# Patient Record
Sex: Female | Born: 1935 | Race: White | Hispanic: No | Marital: Married | Smoking: Former smoker
Health system: Southern US, Community
[De-identification: ages and names within clinical notes are randomized; demographics above are authoritative.]

## PROBLEM LIST (undated history)

## (undated) DIAGNOSIS — G4733 Obstructive sleep apnea (adult) (pediatric): Secondary | ICD-10-CM

## (undated) DIAGNOSIS — Z8711 Personal history of peptic ulcer disease: Secondary | ICD-10-CM

## (undated) DIAGNOSIS — M1990 Unspecified osteoarthritis, unspecified site: Secondary | ICD-10-CM

## (undated) DIAGNOSIS — Z9989 Dependence on other enabling machines and devices: Secondary | ICD-10-CM

## (undated) DIAGNOSIS — R61 Generalized hyperhidrosis: Secondary | ICD-10-CM

## (undated) DIAGNOSIS — Z8719 Personal history of other diseases of the digestive system: Secondary | ICD-10-CM

## (undated) DIAGNOSIS — I6529 Occlusion and stenosis of unspecified carotid artery: Secondary | ICD-10-CM

## (undated) DIAGNOSIS — N6019 Diffuse cystic mastopathy of unspecified breast: Secondary | ICD-10-CM

## (undated) DIAGNOSIS — K2950 Unspecified chronic gastritis without bleeding: Secondary | ICD-10-CM

## (undated) DIAGNOSIS — R4702 Dysphasia: Secondary | ICD-10-CM

## (undated) DIAGNOSIS — L539 Erythematous condition, unspecified: Secondary | ICD-10-CM

## (undated) DIAGNOSIS — E042 Nontoxic multinodular goiter: Secondary | ICD-10-CM

## (undated) DIAGNOSIS — M4800 Spinal stenosis, site unspecified: Secondary | ICD-10-CM

## (undated) DIAGNOSIS — K589 Irritable bowel syndrome without diarrhea: Secondary | ICD-10-CM

## (undated) DIAGNOSIS — M419 Scoliosis, unspecified: Secondary | ICD-10-CM

## (undated) DIAGNOSIS — I341 Nonrheumatic mitral (valve) prolapse: Secondary | ICD-10-CM

## (undated) DIAGNOSIS — K219 Gastro-esophageal reflux disease without esophagitis: Secondary | ICD-10-CM

## (undated) DIAGNOSIS — M47819 Spondylosis without myelopathy or radiculopathy, site unspecified: Secondary | ICD-10-CM

## (undated) DIAGNOSIS — I10 Essential (primary) hypertension: Secondary | ICD-10-CM

## (undated) HISTORY — PX: OTHER SURGICAL HISTORY: SHX169

## (undated) HISTORY — DX: Irritable bowel syndrome, unspecified: K58.9

## (undated) HISTORY — PX: COLONOSCOPY: SHX174

## (undated) HISTORY — DX: Diffuse cystic mastopathy of unspecified breast: N60.19

## (undated) HISTORY — PX: CYSTOSCOPY: SUR368

## (undated) HISTORY — DX: Essential (primary) hypertension: I10

## (undated) HISTORY — DX: Occlusion and stenosis of unspecified carotid artery: I65.29

## (undated) HISTORY — PX: ESOPHAGOGASTRODUODENOSCOPY ENDOSCOPY: SHX5814

## (undated) HISTORY — DX: Unspecified osteoarthritis, unspecified site: M19.90

## (undated) HISTORY — DX: Generalized hyperhidrosis: R61

## (undated) HISTORY — DX: Nonrheumatic mitral (valve) prolapse: I34.1

---
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
## 1976-05-02 HISTORY — PX: VAGINAL HYSTERECTOMY: SUR661

## 1994-05-02 HISTORY — PX: CHOLECYSTECTOMY: SHX55

## 1995-05-03 HISTORY — PX: STOMACH SURGERY: SHX791

## 1997-09-26 ENCOUNTER — Other Ambulatory Visit: Admission: RE | Admit: 1997-09-26 | Discharge: 1997-09-26 | Payer: Self-pay | Admitting: Urology

## 1998-06-02 ENCOUNTER — Ambulatory Visit (HOSPITAL_COMMUNITY): Admission: RE | Admit: 1998-06-02 | Discharge: 1998-06-02 | Payer: Self-pay | Admitting: Gastroenterology

## 2003-05-03 DIAGNOSIS — I341 Nonrheumatic mitral (valve) prolapse: Secondary | ICD-10-CM

## 2003-05-03 HISTORY — DX: Nonrheumatic mitral (valve) prolapse: I34.1

## 2003-05-03 HISTORY — PX: OTHER SURGICAL HISTORY: SHX169

## 2003-10-02 ENCOUNTER — Other Ambulatory Visit: Payer: Self-pay

## 2004-02-02 ENCOUNTER — Ambulatory Visit: Payer: Self-pay | Admitting: General Surgery

## 2004-02-17 ENCOUNTER — Emergency Department: Payer: Self-pay | Admitting: Emergency Medicine

## 2004-02-18 ENCOUNTER — Ambulatory Visit: Payer: Self-pay | Admitting: Urology

## 2004-02-24 ENCOUNTER — Ambulatory Visit: Payer: Self-pay | Admitting: Unknown Physician Specialty

## 2004-02-28 ENCOUNTER — Emergency Department: Payer: Self-pay | Admitting: Emergency Medicine

## 2004-03-04 ENCOUNTER — Ambulatory Visit: Payer: Self-pay | Admitting: Obstetrics and Gynecology

## 2004-03-04 ENCOUNTER — Other Ambulatory Visit: Payer: Self-pay

## 2004-03-05 ENCOUNTER — Ambulatory Visit: Payer: Self-pay | Admitting: Unknown Physician Specialty

## 2004-03-09 ENCOUNTER — Ambulatory Visit: Payer: Self-pay | Admitting: Obstetrics and Gynecology

## 2004-08-04 ENCOUNTER — Ambulatory Visit: Payer: Self-pay | Admitting: Unknown Physician Specialty

## 2004-10-06 ENCOUNTER — Ambulatory Visit: Payer: Self-pay | Admitting: General Surgery

## 2005-01-21 ENCOUNTER — Other Ambulatory Visit: Payer: Self-pay

## 2005-01-21 ENCOUNTER — Inpatient Hospital Stay: Payer: Self-pay | Admitting: Internal Medicine

## 2005-06-03 ENCOUNTER — Ambulatory Visit: Payer: Self-pay | Admitting: Gastroenterology

## 2005-06-30 ENCOUNTER — Emergency Department: Payer: Self-pay | Admitting: Unknown Physician Specialty

## 2005-07-08 ENCOUNTER — Ambulatory Visit: Payer: Self-pay | Admitting: Unknown Physician Specialty

## 2005-10-20 ENCOUNTER — Ambulatory Visit: Payer: Self-pay | Admitting: General Surgery

## 2005-12-21 ENCOUNTER — Ambulatory Visit: Payer: Self-pay | Admitting: Gastroenterology

## 2006-02-10 ENCOUNTER — Ambulatory Visit: Payer: Self-pay | Admitting: Gastroenterology

## 2006-05-13 ENCOUNTER — Inpatient Hospital Stay: Payer: Self-pay | Admitting: Internal Medicine

## 2006-05-13 ENCOUNTER — Other Ambulatory Visit: Payer: Self-pay

## 2006-05-26 ENCOUNTER — Ambulatory Visit: Payer: Self-pay | Admitting: Unknown Physician Specialty

## 2006-10-23 ENCOUNTER — Emergency Department: Payer: Self-pay | Admitting: Emergency Medicine

## 2006-10-31 ENCOUNTER — Ambulatory Visit: Payer: Self-pay | Admitting: General Surgery

## 2006-11-07 ENCOUNTER — Ambulatory Visit: Payer: Self-pay | Admitting: General Surgery

## 2007-01-22 ENCOUNTER — Other Ambulatory Visit: Payer: Self-pay

## 2007-01-22 ENCOUNTER — Emergency Department: Payer: Self-pay | Admitting: Emergency Medicine

## 2007-02-01 ENCOUNTER — Emergency Department: Payer: Self-pay | Admitting: Unknown Physician Specialty

## 2007-02-01 ENCOUNTER — Other Ambulatory Visit: Payer: Self-pay

## 2007-02-05 ENCOUNTER — Emergency Department: Payer: Self-pay | Admitting: Emergency Medicine

## 2007-02-05 ENCOUNTER — Other Ambulatory Visit: Payer: Self-pay

## 2007-05-16 ENCOUNTER — Ambulatory Visit: Payer: Self-pay | Admitting: General Surgery

## 2007-11-01 ENCOUNTER — Ambulatory Visit: Payer: Self-pay | Admitting: General Surgery

## 2007-11-22 ENCOUNTER — Ambulatory Visit: Payer: Self-pay | Admitting: Gastroenterology

## 2008-04-08 ENCOUNTER — Ambulatory Visit: Payer: Self-pay | Admitting: Unknown Physician Specialty

## 2008-04-21 ENCOUNTER — Ambulatory Visit: Payer: Self-pay | Admitting: Gastroenterology

## 2008-05-05 ENCOUNTER — Ambulatory Visit: Payer: Self-pay | Admitting: Gastroenterology

## 2008-06-24 ENCOUNTER — Ambulatory Visit: Payer: Self-pay | Admitting: Podiatry

## 2008-11-18 ENCOUNTER — Ambulatory Visit: Payer: Self-pay | Admitting: General Surgery

## 2008-11-18 ENCOUNTER — Ambulatory Visit: Payer: Self-pay | Admitting: Unknown Physician Specialty

## 2008-12-23 ENCOUNTER — Encounter (INDEPENDENT_AMBULATORY_CARE_PROVIDER_SITE_OTHER): Payer: Self-pay | Admitting: Interventional Radiology

## 2008-12-23 ENCOUNTER — Other Ambulatory Visit: Admission: RE | Admit: 2008-12-23 | Discharge: 2008-12-23 | Payer: Self-pay | Admitting: Interventional Radiology

## 2008-12-23 ENCOUNTER — Encounter: Admission: RE | Admit: 2008-12-23 | Discharge: 2008-12-23 | Payer: Self-pay | Admitting: Surgery

## 2009-01-16 IMAGING — CR DG CHEST 1V PORT
1 series · 1 of 1 positions shown · non-contrast
Comparison: none

REASON FOR EXAM: Chest pain
COMMENTS:

PROCEDURE:     DXR - DXR PORTABLE CHEST SINGLE VIEW  - January 22, 2007 [DATE]
RESULT:     Comparison is made to a prior exam of 05/13/2006.
The lung fields are clear. The heart, mediastinal and osseous structures
show no significant abnormalities.

[view not recorded]
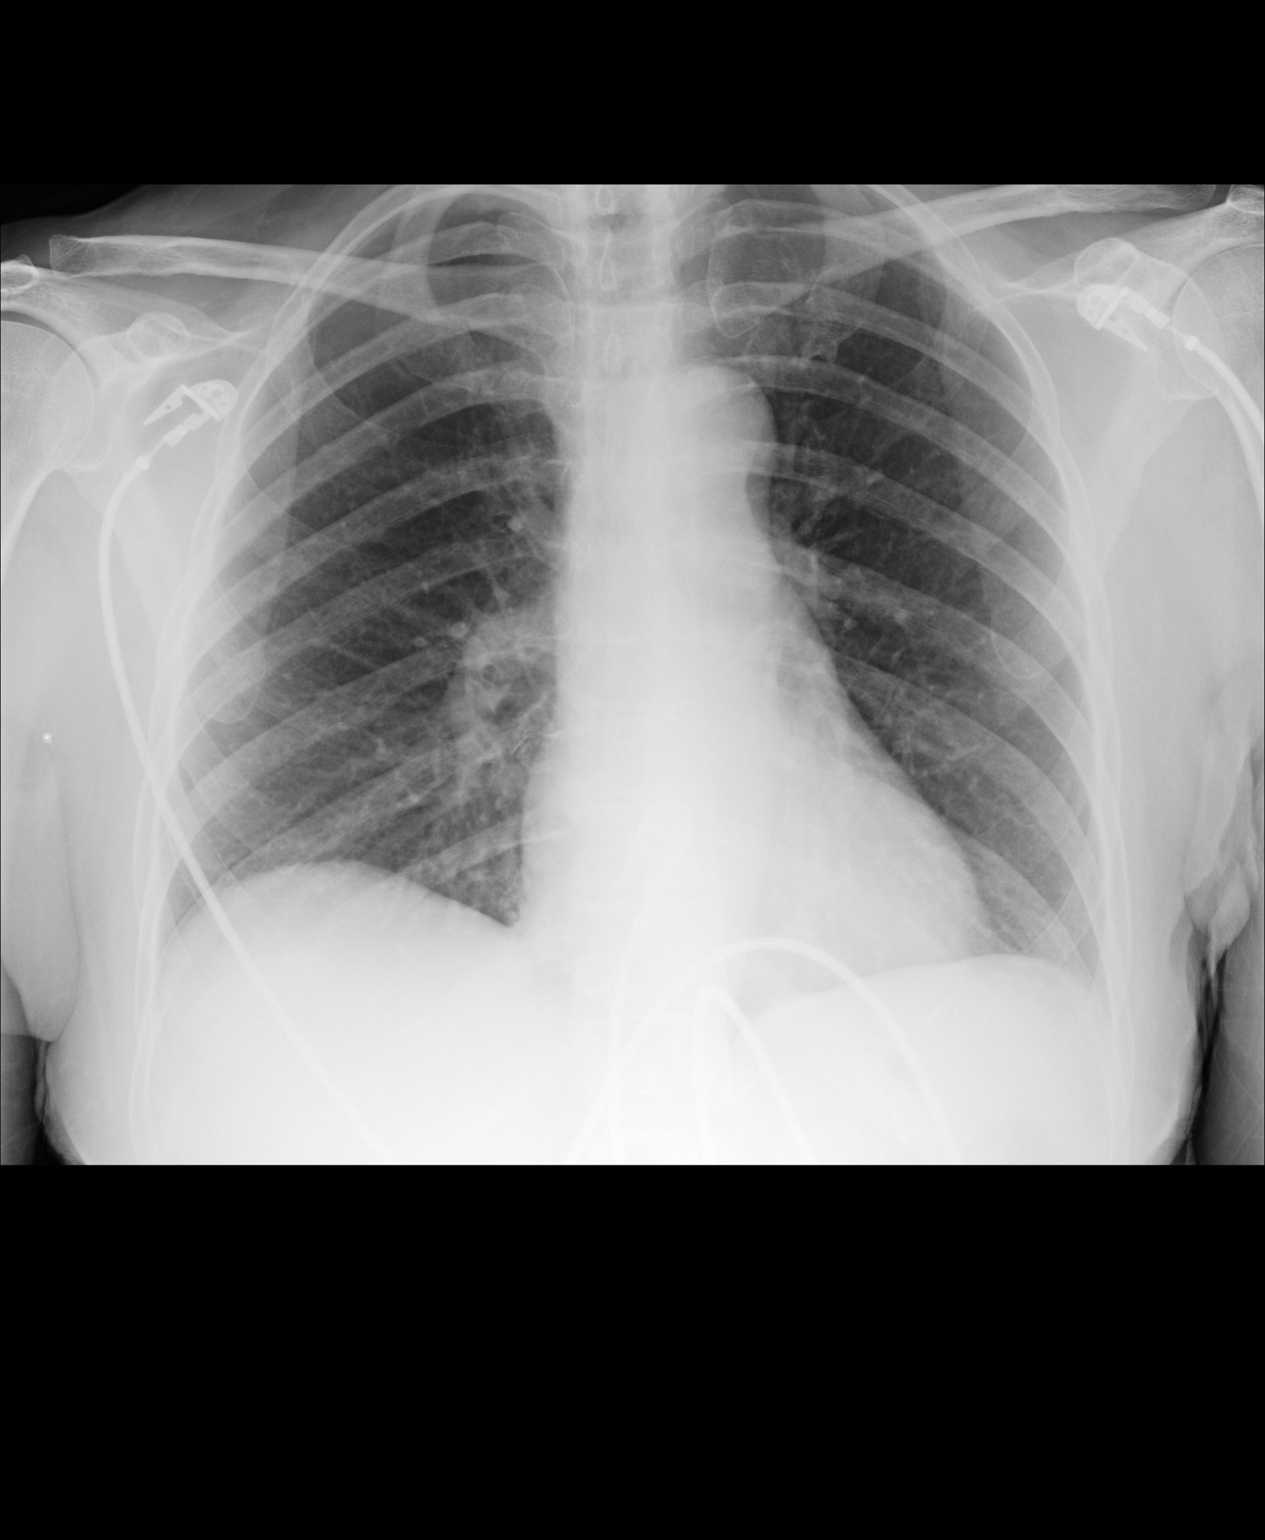

[1 of 1 positions shown; findings below may reference images not displayed]

IMPRESSION: No acute changes are identified.

## 2009-05-02 HISTORY — PX: ROTATOR CUFF REPAIR: SHX139

## 2009-06-17 ENCOUNTER — Encounter: Admission: RE | Admit: 2009-06-17 | Discharge: 2009-06-17 | Payer: Self-pay | Admitting: Surgery

## 2009-11-30 ENCOUNTER — Ambulatory Visit: Payer: Self-pay | Admitting: General Surgery

## 2010-06-17 ENCOUNTER — Observation Stay: Payer: Self-pay | Admitting: Internal Medicine

## 2010-06-28 ENCOUNTER — Other Ambulatory Visit: Payer: Self-pay | Admitting: Surgery

## 2010-06-28 ENCOUNTER — Ambulatory Visit
Admission: RE | Admit: 2010-06-28 | Discharge: 2010-06-28 | Disposition: A | Discharge status: Home / Self Care | Payer: MEDICARE | Source: Ambulatory Visit | Attending: Surgery | Admitting: Surgery

## 2010-06-28 DIAGNOSIS — E041 Nontoxic single thyroid nodule: Secondary | ICD-10-CM

## 2010-06-29 ENCOUNTER — Ambulatory Visit (HOSPITAL_COMMUNITY): Admission: RE | Admit: 2010-06-29 | Payer: MEDICARE | Source: Ambulatory Visit

## 2010-06-29 ENCOUNTER — Other Ambulatory Visit (HOSPITAL_COMMUNITY): Payer: Self-pay | Admitting: Surgery

## 2010-06-29 ENCOUNTER — Other Ambulatory Visit: Payer: Self-pay | Admitting: Surgery

## 2010-06-29 DIAGNOSIS — E041 Nontoxic single thyroid nodule: Secondary | ICD-10-CM

## 2010-06-29 DIAGNOSIS — R1310 Dysphagia, unspecified: Secondary | ICD-10-CM

## 2010-07-06 ENCOUNTER — Ambulatory Visit (HOSPITAL_COMMUNITY)
Admission: RE | Admit: 2010-07-06 | Discharge: 2010-07-06 | Disposition: A | Discharge status: Home / Self Care | Payer: MEDICARE | Source: Ambulatory Visit | Attending: Surgery | Admitting: Surgery

## 2010-07-06 DIAGNOSIS — R1310 Dysphagia, unspecified: Secondary | ICD-10-CM | POA: Insufficient documentation

## 2010-07-13 ENCOUNTER — Ambulatory Visit: Payer: Self-pay | Admitting: Gastroenterology

## 2010-07-16 LAB — PATHOLOGY REPORT

## 2010-07-20 ENCOUNTER — Emergency Department: Payer: Self-pay | Admitting: Emergency Medicine

## 2010-09-20 ENCOUNTER — Encounter (INDEPENDENT_AMBULATORY_CARE_PROVIDER_SITE_OTHER): Payer: Self-pay | Admitting: Surgery

## 2010-12-07 ENCOUNTER — Ambulatory Visit: Payer: Self-pay | Admitting: General Surgery

## 2010-12-27 ENCOUNTER — Encounter (INDEPENDENT_AMBULATORY_CARE_PROVIDER_SITE_OTHER): Payer: Self-pay | Admitting: Surgery

## 2010-12-28 ENCOUNTER — Ambulatory Visit
Admission: RE | Admit: 2010-12-28 | Discharge: 2010-12-28 | Disposition: A | Discharge status: Home / Self Care | Payer: Medicare Other | Source: Ambulatory Visit | Attending: Surgery | Admitting: Surgery

## 2010-12-28 DIAGNOSIS — E041 Nontoxic single thyroid nodule: Secondary | ICD-10-CM

## 2011-01-26 ENCOUNTER — Encounter (INDEPENDENT_AMBULATORY_CARE_PROVIDER_SITE_OTHER): Payer: Self-pay | Admitting: Surgery

## 2011-01-26 ENCOUNTER — Ambulatory Visit (INDEPENDENT_AMBULATORY_CARE_PROVIDER_SITE_OTHER): Payer: Medicare Other | Admitting: Surgery

## 2011-01-26 VITALS — BP 128/68 | HR 64 | Temp 97.8°F | Resp 16 | Ht 68.0 in | Wt 136.2 lb

## 2011-01-26 DIAGNOSIS — E042 Nontoxic multinodular goiter: Secondary | ICD-10-CM

## 2011-01-26 NOTE — Progress Notes (Signed)
Visit Diagnoses: 1. Multinodular goiter (nontoxic)     HISTORY: Patient is a 77 year old female followed for bilateral thyroid nodules. Patient has a suspicious nodule in the right thyroid lobe which was noted to have increased in size on her ultrasound from February 2012. At my request she underwent a 6 month followup thyroid ultrasound at the end of August 2012. This again showed slight enlargement of the right inferior nodule, now measuring 1.7 cm in size and containing calcifications. Radiology has recommended fine-needle aspiration biopsy. Patient returns today to discuss these findings and to make arrangements for biopsy.  Patient also underwent a swallowing evaluation after her last office visit here in February 2012. Swallowing study did show primary dysphagia with abnormality occurring at the level of the instrumentation of her cervical spine. No added precautions were required. No evidence of aspiration was seen.   PERTINENT REVIEW OF SYSTEMS: Patient continues to complain of dysphagia. She has not had episodes of aspiration.   EXAM: HEENT: normocephalic; pupils equal and reactive; sclerae clear; dentition good; mucous membranes moist NECK:  Diffusely nodular thyroid without dominant nodule; symmetric on extension; no palpable anterior or posterior cervical lymphadenopathy; no supraclavicular masses; no tenderness CHEST: clear to auscultation bilaterally without rales, rhonchi, or wheezes CARDIAC: regular rate and rhythm without significant murmur; peripheral pulses are full EXT:  non-tender without edema; no deformity NEURO: no gross focal deficits; no sign of tremor   IMPRESSION: #1 multiple bilateral thyroid nodules arising in a small goiter #2 enlarging right inferior thyroid nodule, 1.7 cm, with calcifications #3 complaints of dysphagia, swallowing study demonstrating relationship to the cervical spine fusion   PLAN: The enlarging nodule in the right inferior thyroid lobe  is worrisome. Therefore we will proceed with ultrasound-guided fine needle aspiration biopsy in the immediate future. I will contact the patient with the cytopathology results. If the pathology is benign, then I believe we can continue to follow her clinically. Certainly if there is any evidence of atypia or malignancy, she may require thyroidectomy. Patient and her husband understand and agreed to proceed with fine needle aspiration biopsy.   Velora Heckler, MD, FACS General & Endocrine Surgery [facility]

## 2011-01-31 ENCOUNTER — Ambulatory Visit
Admission: RE | Admit: 2011-01-31 | Discharge: 2011-01-31 | Disposition: A | Discharge status: Home / Self Care | Payer: Medicare Other | Source: Ambulatory Visit | Attending: Surgery | Admitting: Surgery

## 2011-01-31 ENCOUNTER — Other Ambulatory Visit (HOSPITAL_COMMUNITY)
Admission: RE | Admit: 2011-01-31 | Discharge: 2011-01-31 | Disposition: A | Discharge status: Home / Self Care | Payer: Medicare Other | Source: Ambulatory Visit | Attending: Interventional Radiology | Admitting: Interventional Radiology

## 2011-01-31 DIAGNOSIS — E049 Nontoxic goiter, unspecified: Secondary | ICD-10-CM | POA: Insufficient documentation

## 2011-01-31 DIAGNOSIS — E042 Nontoxic multinodular goiter: Secondary | ICD-10-CM

## 2011-02-02 NOTE — Progress Notes (Signed)
Quick Note:  Please contact patient with benign path results. TMG ______ 

## 2011-02-03 ENCOUNTER — Telehealth (INDEPENDENT_AMBULATORY_CARE_PROVIDER_SITE_OTHER): Payer: Self-pay

## 2011-02-03 NOTE — Telephone Encounter (Signed)
Called patient with benign path report- no answer- left message to return call. RMP

## 2011-02-11 ENCOUNTER — Other Ambulatory Visit (INDEPENDENT_AMBULATORY_CARE_PROVIDER_SITE_OTHER): Payer: Self-pay | Admitting: Surgery

## 2011-02-11 ENCOUNTER — Telehealth (INDEPENDENT_AMBULATORY_CARE_PROVIDER_SITE_OTHER): Payer: Self-pay

## 2011-02-11 DIAGNOSIS — E042 Nontoxic multinodular goiter: Secondary | ICD-10-CM

## 2011-02-11 NOTE — Telephone Encounter (Signed)
I notified the patient that Dr Gerrit Friends wants to see her in 6 months with an ultrasound.  We will call her when that schedule is available.

## 2011-02-11 NOTE — Progress Notes (Signed)
Done- ultrasound ordered for . Patient aware.RMP

## 2011-04-07 ENCOUNTER — Emergency Department: Payer: Self-pay | Admitting: Unknown Physician Specialty

## 2011-05-03 DIAGNOSIS — N6019 Diffuse cystic mastopathy of unspecified breast: Secondary | ICD-10-CM

## 2011-05-03 DIAGNOSIS — I6529 Occlusion and stenosis of unspecified carotid artery: Secondary | ICD-10-CM

## 2011-05-03 HISTORY — DX: Occlusion and stenosis of unspecified carotid artery: I65.29

## 2011-05-03 HISTORY — DX: Diffuse cystic mastopathy of unspecified breast: N60.19

## 2011-05-12 ENCOUNTER — Emergency Department: Payer: Self-pay | Admitting: Internal Medicine

## 2011-05-12 LAB — TROPONIN I: Troponin-I: 0.02 ng/mL

## 2011-05-12 LAB — COMPREHENSIVE METABOLIC PANEL
Alkaline Phosphatase: 93 U/L (ref 50–136)
Anion Gap: 6 — ABNORMAL LOW (ref 7–16)
Calcium, Total: 9.1 mg/dL (ref 8.5–10.1)
Co2: 30 mmol/L (ref 21–32)
EGFR (Non-African Amer.): >60
Osmolality: 273 (ref 275–301)
SGPT (ALT): 25 U/L
Sodium: 137 mmol/L (ref 136–145)

## 2011-05-12 LAB — CBC
MCHC: 33.5 g/dL (ref 32.0–36.0)
Platelet: 223 10*3/uL (ref 150–440)
RDW: 12.4 % (ref 11.5–14.5)
WBC: 6.5 10*3/uL (ref 3.6–11.0)

## 2011-07-13 ENCOUNTER — Ambulatory Visit
Admission: RE | Admit: 2011-07-13 | Discharge: 2011-07-13 | Disposition: A | Discharge status: Home / Self Care | Payer: Medicare Other | Source: Ambulatory Visit | Attending: Surgery | Admitting: Surgery

## 2011-07-13 DIAGNOSIS — E042 Nontoxic multinodular goiter: Secondary | ICD-10-CM

## 2011-07-21 ENCOUNTER — Encounter (INDEPENDENT_AMBULATORY_CARE_PROVIDER_SITE_OTHER): Payer: Self-pay | Admitting: Surgery

## 2011-07-26 ENCOUNTER — Ambulatory Visit (INDEPENDENT_AMBULATORY_CARE_PROVIDER_SITE_OTHER): Payer: Medicare Other | Admitting: Surgery

## 2011-07-26 ENCOUNTER — Other Ambulatory Visit (INDEPENDENT_AMBULATORY_CARE_PROVIDER_SITE_OTHER): Payer: Self-pay | Admitting: Surgery

## 2011-07-26 ENCOUNTER — Encounter (INDEPENDENT_AMBULATORY_CARE_PROVIDER_SITE_OTHER): Payer: Self-pay | Admitting: Surgery

## 2011-07-26 ENCOUNTER — Telehealth (INDEPENDENT_AMBULATORY_CARE_PROVIDER_SITE_OTHER): Payer: Self-pay

## 2011-07-26 VITALS — BP 126/68 | HR 70 | Temp 97.6°F | Resp 16 | Ht 68.0 in | Wt 139.0 lb

## 2011-07-26 DIAGNOSIS — E042 Nontoxic multinodular goiter: Secondary | ICD-10-CM

## 2011-07-26 LAB — TSH: TSH: 0.827 u[IU]/mL (ref 0.350–4.500)

## 2011-07-26 NOTE — Telephone Encounter (Signed)
Dysphagia evaluation will be on 08/03/2011 at 12:30. [place] [address],[place]. Phone [phone].   Please call and confirm.

## 2011-07-26 NOTE — Patient Instructions (Signed)
See speech / language pathologist for follow up of dysphagia.  Appointment to be arranged.  tmg

## 2011-07-26 NOTE — Progress Notes (Signed)
Visit Diagnoses: 1. Multinodular goiter (nontoxic)     HISTORY: Patient is a 78 year old white female who is followed for multinodular thyroid goiter. She has had previous fine needle aspiration biopsies of the dominant nodules. Cytopathology results have always been benign. Patient does not take thyroid hormone. She has not had a recent TSH level.  Patient underwent followup thyroid ultrasound at my request on July 13, 2011. This showed an essentially normal-sized thyroid gland with the right lobe slightly larger than the left. There were multiple bilateral small thyroid nodules noted. All were less than 15 mm in size on the present study. The findings were felt to represent a stable multinodular thyroid goiter.  Patient continues to complain of dysphagia. She has difficulty swallowing pills. She occasionally has to manually retrieve these pills with a gloved finger.  PERTINENT REVIEW OF SYSTEMS: The patient complains of persistent dysphagia, particularly with pills. No other compressive symptoms related to the thyroid. No tremors. No palpitations.  EXAM: HEENT: normocephalic; pupils equal and reactive; sclerae clear; dentition good; mucous membranes moist NECK:  Small bilateral thyroid nodules, no dominant masses, no tenderness; symmetric on extension; no palpable anterior or posterior cervical lymphadenopathy; no supraclavicular masses; no tenderness CHEST: clear to auscultation bilaterally without rales, rhonchi, or wheezes CARDIAC: regular rate and rhythm without significant murmur; peripheral pulses are full EXT:  non-tender without edema; no deformity NEURO: no gross focal deficits; no sign of tremor   IMPRESSION: #1 multinodular thyroid goiter, small, clinically stable #2 solid food dysphagia  PLAN: The patient will have a TSH level of pain today at the laboratory. I will review those results. At the request of the patient she will undergo evaluation by speech pathology for  persistent problems with solid food dysphagia.  Patient will return in one year. We will repeat her thyroid ultrasound at that time.  Velora Heckler, MD, FACS General & Endocrine Surgery [facility]

## 2011-07-27 NOTE — Progress Notes (Signed)
Patient aware of + result

## 2011-08-03 ENCOUNTER — Ambulatory Visit: Payer: Medicare Other | Attending: Surgery

## 2011-08-03 DIAGNOSIS — R1313 Dysphagia, pharyngeal phase: Secondary | ICD-10-CM | POA: Insufficient documentation

## 2011-08-03 DIAGNOSIS — IMO0001 Reserved for inherently not codable concepts without codable children: Secondary | ICD-10-CM | POA: Insufficient documentation

## 2011-12-08 ENCOUNTER — Ambulatory Visit: Payer: Self-pay | Admitting: General Surgery

## 2012-05-25 ENCOUNTER — Emergency Department: Payer: Self-pay | Admitting: Emergency Medicine

## 2012-05-25 LAB — PRO B NATRIURETIC PEPTIDE: B-Type Natriuretic Peptide: 81 pg/mL (ref 0–450)

## 2012-05-25 LAB — COMPREHENSIVE METABOLIC PANEL
Albumin: 3.9 g/dL (ref 3.4–5.0)
Anion Gap: 7 (ref 7–16)
Chloride: 102 mmol/L (ref 98–107)
Creatinine: 0.85 mg/dL (ref 0.60–1.30)
EGFR (Non-African Amer.): >60
Glucose: 91 mg/dL (ref 65–99)
SGOT(AST): 24 U/L (ref 15–37)

## 2012-05-25 LAB — CBC
HCT: 37.1 % (ref 35.0–47.0)
HGB: 12.7 g/dL (ref 12.0–16.0)
MCH: 30.8 pg (ref 26.0–34.0)
MCHC: 34.2 g/dL (ref 32.0–36.0)
Platelet: 199 10*3/uL (ref 150–440)
WBC: 7.6 10*3/uL (ref 3.6–11.0)

## 2012-05-25 LAB — CK TOTAL AND CKMB (NOT AT ARMC)
CK, Total: 157 U/L (ref 21–215)
CK-MB: 4 ng/mL — ABNORMAL HIGH (ref 0.5–3.6)

## 2012-05-25 LAB — TROPONIN I: Troponin-I: <0.02 ng/mL

## 2012-06-04 ENCOUNTER — Other Ambulatory Visit: Payer: Self-pay | Admitting: Urology

## 2012-06-08 ENCOUNTER — Encounter (HOSPITAL_BASED_OUTPATIENT_CLINIC_OR_DEPARTMENT_OTHER): Payer: Self-pay | Admitting: *Deleted

## 2012-06-08 NOTE — Progress Notes (Signed)
NPO AFTER MN. ARRIVES AT 0700. NEEDS ISTAT AND EKG. WILL TAKE AMLODIPINE AND NEXIUM AM OF SURG W/ SIP OF WATER.

## 2012-06-11 IMAGING — CT CT CHEST W/ CM
1 of 2 series · 14 of 32 positions shown, 18 images · non-contrast
Comparison: none

REASON FOR EXAM: chest pain
COMMENTS:

[Series 5: lung windows · axial · 0.79mm/px · z∈[-460,-202]mm · 14 of 102 slices shown, 18 images]
[im 8/102  mediastinal]
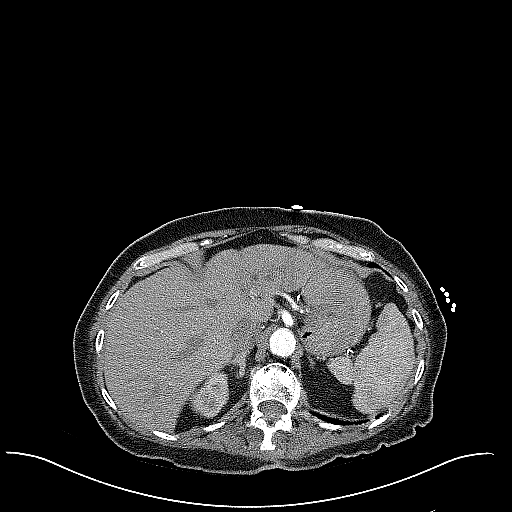
[im 8/102  lung]
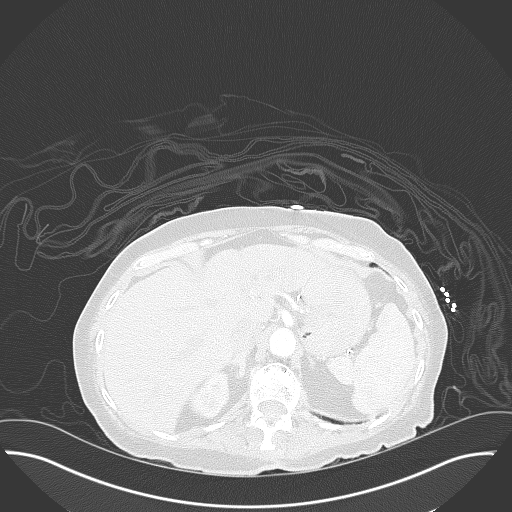
[im 16/102  lung]
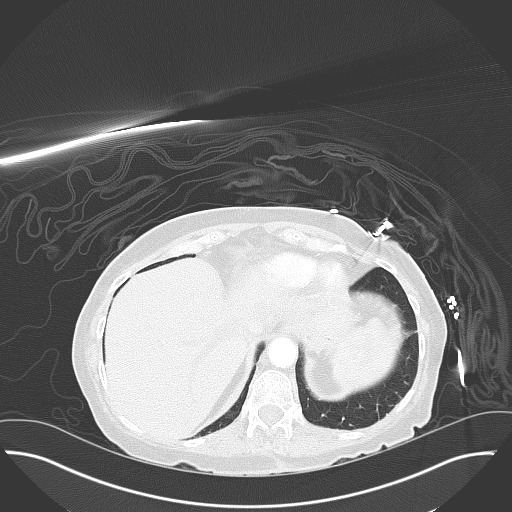
[im 24/102  lung]
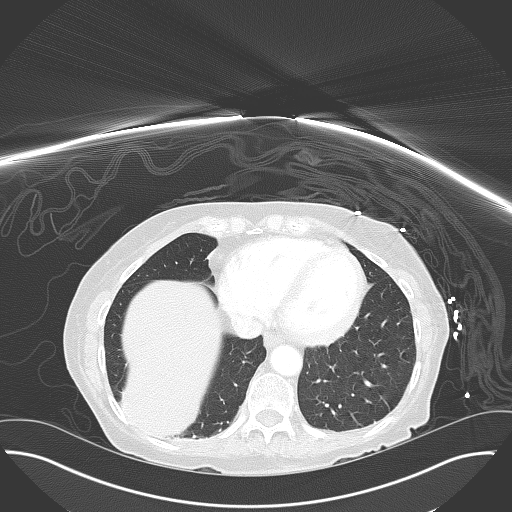
[im 32/102  lung]
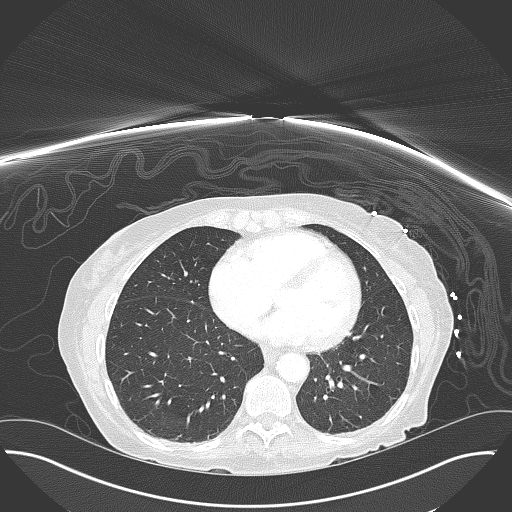
[im 39/102  mediastinal]
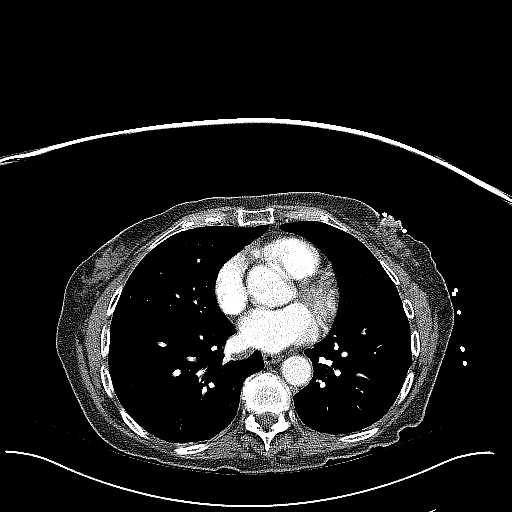
[im 39/102  lung]
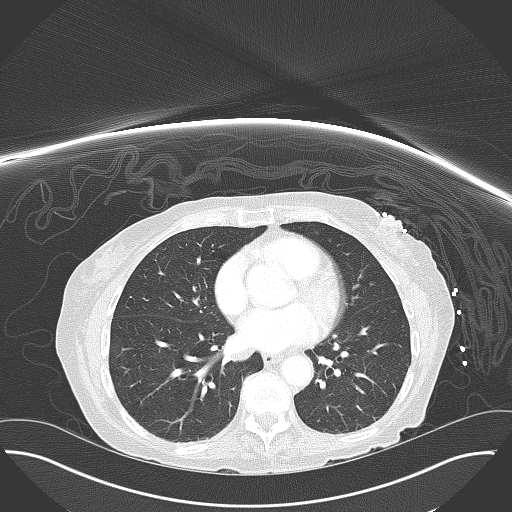
[im 47/102  lung]
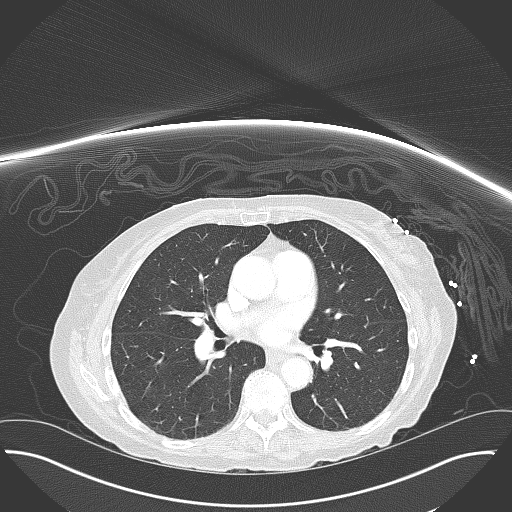
[im 48/102  lung]
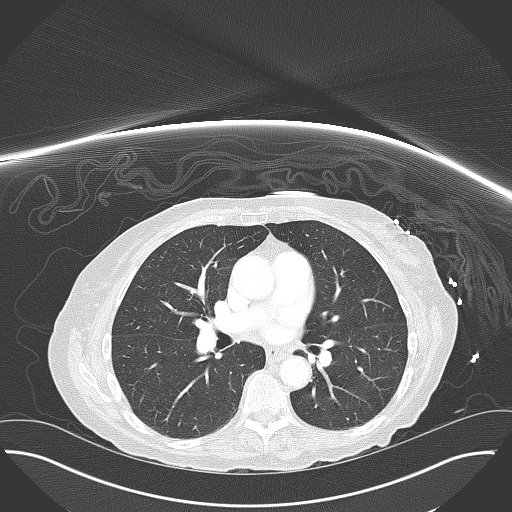
[im 51/102  lung]
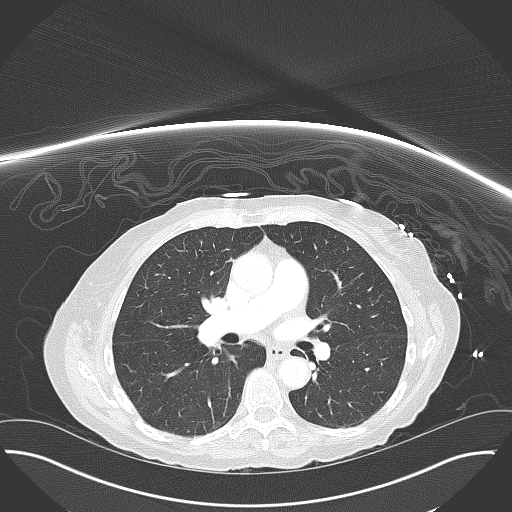
[im 55/102  mediastinal]
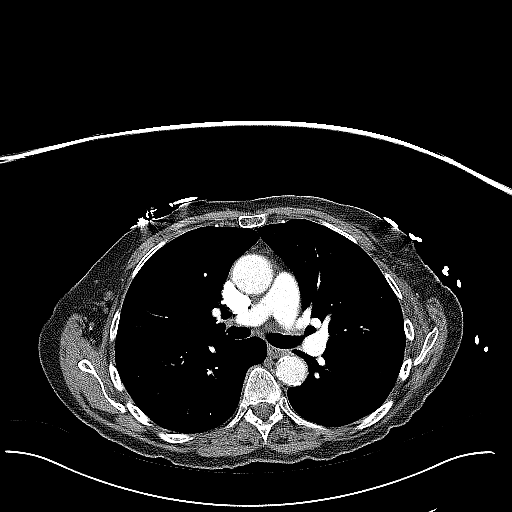
[im 55/102  lung]
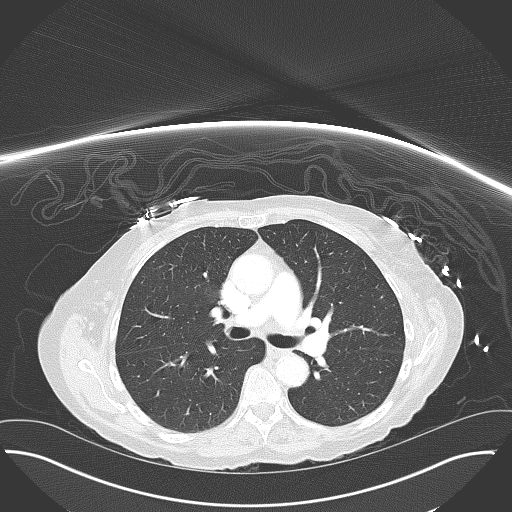
[im 63/102  lung]
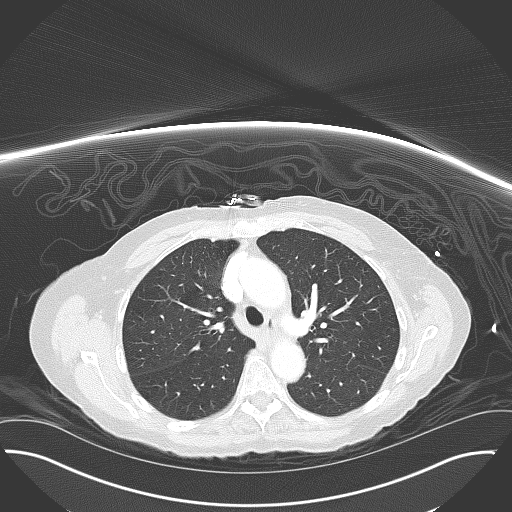
[im 70/102  lung]
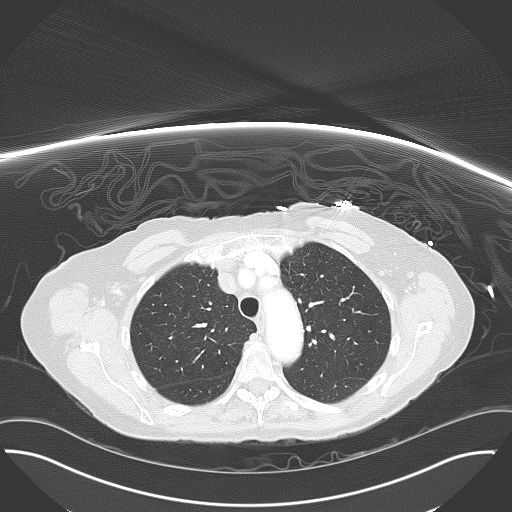
[im 78/102  lung]
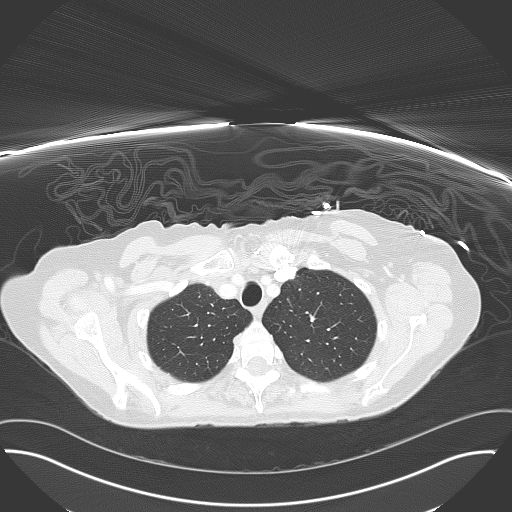
[im 86/102  mediastinal]
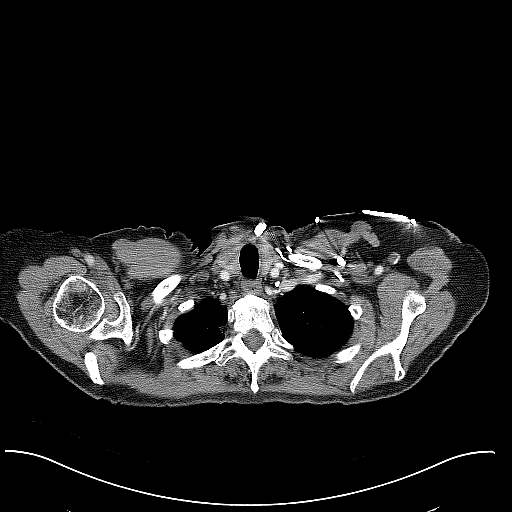
[im 86/102  lung]
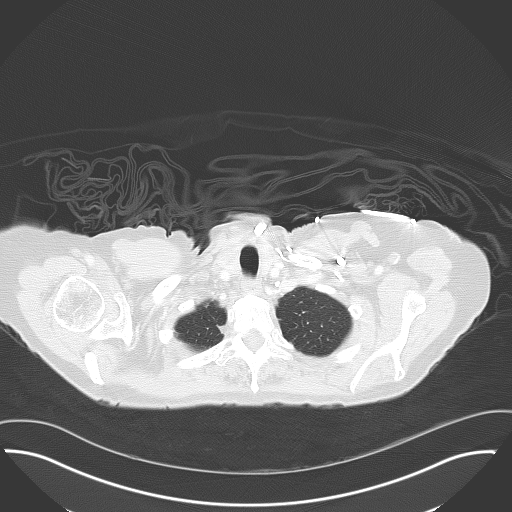
[im 94/102  lung]
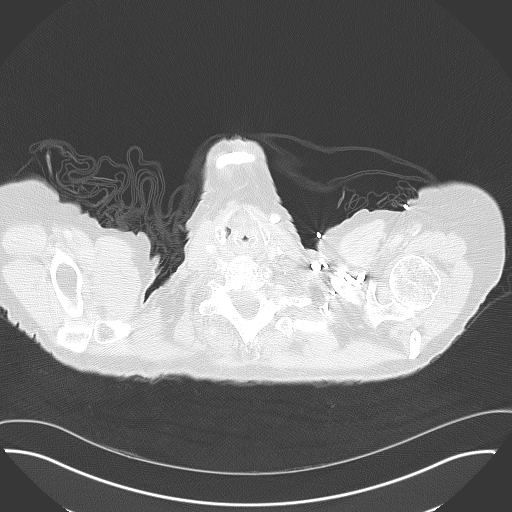

[14 of 32 positions shown; findings below may reference images not displayed]

PROCEDURE:     CT  - CT CHEST (FOR PE) W  - June 17, 2010  [DATE]

RESULT:     Emergent CT of the chest is performed with 75 mL of Ssovue-ZBB
iodinated intravenous contrast with images reconstructed at 3 mm slice
thickness in the axial plane. Comparison is made images of 05/26/2006.

The thoracic aorta is normal in caliber without dissection. There is no
pleural or pericardial effusion area the pulmonary arteries have no filling
defect to suggest pulmonary embolism. There is no mediastinal or hilar mass
or adenopathy. The thyroid demonstrate some punctate calcification in the
left lobe on image 16 with a somewhat heterogeneous appearance and possible
mild enlargement. These changes appear to be stable compared to the previous
exam including calcification. There is no evidence of adenopathy. No
interstitial edema, pulmonary infiltrate, pleural effusion or pneumothorax
is evident. No pulmonary mass is appreciated.
IMPRESSION: 1. No acute cardiopulmonary disease.
2. No thoracic aortic aneurysm.
3. No pulmonary embolism.
4. No thoracic aortic dissection.

## 2012-06-12 ENCOUNTER — Encounter (HOSPITAL_BASED_OUTPATIENT_CLINIC_OR_DEPARTMENT_OTHER)
Admission: RE | Disposition: A | Discharge status: Home / Self Care | Payer: Self-pay | Source: Ambulatory Visit | Attending: Urology

## 2012-06-12 ENCOUNTER — Ambulatory Visit (HOSPITAL_BASED_OUTPATIENT_CLINIC_OR_DEPARTMENT_OTHER)
Admission: RE | Admit: 2012-06-12 | Discharge: 2012-06-12 | Disposition: A | Discharge status: Home / Self Care | Payer: Medicare Other | Source: Ambulatory Visit | Attending: Urology | Admitting: Urology

## 2012-06-12 ENCOUNTER — Ambulatory Visit (HOSPITAL_BASED_OUTPATIENT_CLINIC_OR_DEPARTMENT_OTHER): Payer: Medicare Other | Admitting: Anesthesiology

## 2012-06-12 ENCOUNTER — Encounter (HOSPITAL_BASED_OUTPATIENT_CLINIC_OR_DEPARTMENT_OTHER): Payer: Self-pay | Admitting: Anesthesiology

## 2012-06-12 ENCOUNTER — Encounter (HOSPITAL_BASED_OUTPATIENT_CLINIC_OR_DEPARTMENT_OTHER): Payer: Self-pay

## 2012-06-12 DIAGNOSIS — N94819 Vulvodynia, unspecified: Secondary | ICD-10-CM | POA: Insufficient documentation

## 2012-06-12 DIAGNOSIS — I10 Essential (primary) hypertension: Secondary | ICD-10-CM | POA: Insufficient documentation

## 2012-06-12 DIAGNOSIS — L293 Anogenital pruritus, unspecified: Secondary | ICD-10-CM | POA: Insufficient documentation

## 2012-06-12 DIAGNOSIS — Z79899 Other long term (current) drug therapy: Secondary | ICD-10-CM | POA: Insufficient documentation

## 2012-06-12 DIAGNOSIS — G4733 Obstructive sleep apnea (adult) (pediatric): Secondary | ICD-10-CM | POA: Insufficient documentation

## 2012-06-12 DIAGNOSIS — R300 Dysuria: Secondary | ICD-10-CM | POA: Insufficient documentation

## 2012-06-12 DIAGNOSIS — K219 Gastro-esophageal reflux disease without esophagitis: Secondary | ICD-10-CM | POA: Insufficient documentation

## 2012-06-12 HISTORY — PX: CYSTOSCOPY WITH BIOPSY: SHX5122

## 2012-06-12 HISTORY — DX: Dependence on other enabling machines and devices: Z99.89

## 2012-06-12 HISTORY — DX: Personal history of other diseases of the digestive system: Z87.19

## 2012-06-12 HISTORY — DX: Erythematous condition, unspecified: L53.9

## 2012-06-12 HISTORY — DX: Nontoxic multinodular goiter: E04.2

## 2012-06-12 HISTORY — DX: Dysphasia: R47.02

## 2012-06-12 HISTORY — DX: Gastro-esophageal reflux disease without esophagitis: K21.9

## 2012-06-12 HISTORY — DX: Spondylosis without myelopathy or radiculopathy, site unspecified: M47.819

## 2012-06-12 HISTORY — DX: Personal history of peptic ulcer disease: Z87.11

## 2012-06-12 HISTORY — DX: Unspecified chronic gastritis without bleeding: K29.50

## 2012-06-12 HISTORY — DX: Scoliosis, unspecified: M41.9

## 2012-06-12 HISTORY — PX: VULVA /PERINEUM BIOPSY: SHX319

## 2012-06-12 HISTORY — DX: Spinal stenosis, site unspecified: M48.00

## 2012-06-12 HISTORY — DX: Obstructive sleep apnea (adult) (pediatric): G47.33

## 2012-06-12 LAB — POCT I-STAT 4, (NA,K, GLUC, HGB,HCT)
Glucose, Bld: 108 mg/dL — ABNORMAL HIGH (ref 70–99)
Potassium: 3.8 mEq/L (ref 3.5–5.1)
Sodium: 138 mEq/L (ref 135–145)

## 2012-06-12 SURGERY — CYSTOSCOPY, WITH BIOPSY
Anesthesia: General | Site: Vulva | Wound class: Clean Contaminated

## 2012-06-12 MED ORDER — PROPOFOL 10 MG/ML IV BOLUS
INTRAVENOUS | Status: DC | PRN
  Administered 2012-06-12: 180 mg via INTRAVENOUS
  Administered 2012-06-12: 50 mg via INTRAVENOUS

## 2012-06-12 MED ORDER — FENTANYL CITRATE 0.05 MG/ML IJ SOLN
25.0000 ug | INTRAMUSCULAR | Status: DC | PRN
  Administered 2012-06-12: 25 ug via INTRAVENOUS
  Filled 2012-06-12: qty 1

## 2012-06-12 MED ORDER — ONDANSETRON HCL 4 MG/2ML IJ SOLN
INTRAMUSCULAR | Status: DC | PRN
  Administered 2012-06-12: 4 mg via INTRAVENOUS

## 2012-06-12 MED ORDER — FENTANYL CITRATE 0.05 MG/ML IJ SOLN
INTRAMUSCULAR | Status: DC | PRN
  Administered 2012-06-12 (×2): 50 ug via INTRAVENOUS

## 2012-06-12 MED ORDER — DEXAMETHASONE SODIUM PHOSPHATE 4 MG/ML IJ SOLN
INTRAMUSCULAR | Status: DC | PRN
  Administered 2012-06-12: 8 mg via INTRAVENOUS

## 2012-06-12 MED ORDER — LIDOCAINE HCL (CARDIAC) 20 MG/ML IV SOLN
INTRAVENOUS | Status: DC | PRN
  Administered 2012-06-12: 60 mg via INTRAVENOUS

## 2012-06-12 MED ORDER — LACTATED RINGERS IV SOLN
INTRAVENOUS | Status: DC
  Administered 2012-06-12: 08:00:00 via INTRAVENOUS
  Filled 2012-06-12: qty 1000

## 2012-06-12 MED ORDER — MIDAZOLAM HCL 5 MG/5ML IJ SOLN
INTRAMUSCULAR | Status: DC | PRN
  Administered 2012-06-12: 2 mg via INTRAVENOUS

## 2012-06-12 MED ORDER — VANCOMYCIN HCL IN DEXTROSE 1-5 GM/200ML-% IV SOLN
1000.0000 mg | Freq: Once | INTRAVENOUS | Status: AC
  Administered 2012-06-12: 1000 mg via INTRAVENOUS
  Filled 2012-06-12: qty 200

## 2012-06-12 MED ORDER — LACTATED RINGERS IV SOLN
INTRAVENOUS | Status: DC
  Filled 2012-06-12: qty 1000

## 2012-06-12 MED ORDER — STERILE WATER FOR IRRIGATION IR SOLN
Status: DC | PRN
  Administered 2012-06-12: 3000 mL

## 2012-06-12 MED ORDER — BUPIVACAINE-EPINEPHRINE 0.5% -1:200000 IJ SOLN
INTRAMUSCULAR | Status: DC | PRN
  Administered 2012-06-12: 5 mL

## 2012-06-12 SURGICAL SUPPLY — 27 items
ADH SKN CLS APL DERMABOND .7 (GAUZE/BANDAGES/DRESSINGS) ×2
BAG URO CATCHER STRL LF (DRAPE) ×2 IMPLANT
BLADE SURG 15 STRL LF DISP TIS (BLADE) IMPLANT
BLADE SURG 15 STRL SS (BLADE) ×3
CANISTER SUCT LVC 12 LTR MEDI- (MISCELLANEOUS) ×1 IMPLANT
CATH ROBINSON RED A/P 14FR (CATHETERS) IMPLANT
CLOTH BEACON ORANGE TIMEOUT ST (SAFETY) ×2 IMPLANT
DERMABOND ADVANCED (GAUZE/BANDAGES/DRESSINGS) ×1
DERMABOND ADVANCED .7 DNX12 (GAUZE/BANDAGES/DRESSINGS) IMPLANT
DRAPE CAMERA CLOSED 9X96 (DRAPES) ×3 IMPLANT
ELECT REM PT RETURN 9FT ADLT (ELECTROSURGICAL) ×3
ELECTRODE REM PT RTRN 9FT ADLT (ELECTROSURGICAL) ×2 IMPLANT
GLOVE BIO SURGEON STRL SZ 6.5 (GLOVE) ×1 IMPLANT
GLOVE BIO SURGEON STRL SZ7.5 (GLOVE) ×3 IMPLANT
GLOVE ECLIPSE 6.0 STRL STRAW (GLOVE) ×1 IMPLANT
GOWN PREVENTION PLUS LG XLONG (DISPOSABLE) ×3 IMPLANT
GOWN STRL NON-REIN LRG LVL3 (GOWN DISPOSABLE) ×5 IMPLANT
NDL HYPO 18GX1.5 BLUNT FILL (NEEDLE) IMPLANT
NDL HYPO 25X1 1.5 SAFETY (NEEDLE) IMPLANT
NEEDLE HYPO 18GX1.5 BLUNT FILL (NEEDLE) IMPLANT
NEEDLE HYPO 25X1 1.5 SAFETY (NEEDLE) ×3 IMPLANT
PACK CYSTOSCOPY (CUSTOM PROCEDURE TRAY) ×3 IMPLANT
PENCIL BUTTON HOLSTER BLD 10FT (ELECTRODE) ×1 IMPLANT
SUT MNCRL AB 4-0 PS2 18 (SUTURE) ×1 IMPLANT
SYR 20CC LL (SYRINGE) IMPLANT
SYR CONTROL 10ML LL (SYRINGE) ×1 IMPLANT
WATER STERILE IRR 3000ML UROMA (IV SOLUTION) ×3 IMPLANT

## 2012-06-12 NOTE — H&P (Signed)
Kristina West is an 79 y.o. female.    Chief Complaint: Pre-OP Cysto and Vulvar Biopsy  HPI:   1 - Female Pelvic Pain / Vulvar Pain / Vulvar Erythema - Pt with long h/o of "spells" of low pelvic and labial pain that come and go every few mos. Denies relation to bladder filling or emptying. Perisistant despite UCX neg x many via PCP and GYN. Had negative eval by Leonette Monarch in [place] previously including office cysto. Remote 10PY smoker.  Pelvic 05/2012 with tenderness over erythemaouts labia / vulva only. NO bladder / urethral tenderness.  PVR 05/2012 "0mL".   PMH sig for back surgery, hyst, chole, appy, chornic pain (on chronic narcotics). She has 25 medications allergies listed.   Today [name] is seen for cysto and vulvar biopsy to further characterize above. Her most recent UCX shows minimal growth of staph and enterococcus (?contaminant) for which she has been on CX-specific therapy.  Past Medical History  Diagnosis Date  . Hypertension   . Night sweats   . IBS (irritable bowel syndrome)   . Arthritis     spinal  . Hearing loss BILATERAL HEARING AIDS  . GERD (gastroesophageal reflux disease)   . Dysphasia SOLID FOODS SECONDARY TO  CERVICAL FUSIONS  . Multinodular goiter THYROID--  FOLLOWED BY DR Gerrit Friends  . Multilevel spondylosis   . Spinal stenosis   . OSA on CPAP   . Erythema VULVAR  . Scoliosis   . Chronic gastritis   . History of gastric ulcer     Past Surgical History  Procedure Laterality Date  . Rotator cuff repair  2011    LEFT  . Multiple cervical spine surgery's  1977  --  LAST ONE 2011    DISKECTOMY/ LAMINECTOMY/ FUSION'S  C1  --  C7  . Lumbar spine surgery's  X3  LAST ONE 2008    DISKECTOMY/ FUSION'S   OF L3  -  S1  . Vaginal hysterectomy  1978  . Bilateral salpingoophectomy  2005  . Cholecystectomy  1996  . Cystoscopy  X3  1976; 1999; 2005    REMOVAL POLYPS/ CAUTERIZATION BLADDER ULCERS  . Esophagogastroduodenoscopy endoscopy  MULTIPLE    GASTRIC ULCER  1998/  CHRONIC GASTRITIS/  ESOPHAGEAL DILATION  . Appendectomy  1943    Family History  Problem Relation Age of Onset  . Parkinsonism Brother   . Alzheimer's disease Brother   . Cancer Brother     lower gums   Social History:  reports that she quit smoking about 20 years ago. Her smoking use included Cigarettes. She smoked 0.00 packs per day. She has never used smokeless tobacco. She reports that she does not drink alcohol or use illicit drugs.  Allergies:  Allergies  Allergen Reactions  . Sulfamethoxazole Shortness Of Breath    CHEST PAIN  . Vioxx (Rofecoxib) Shortness Of Breath and Other (See Comments)    TROUBLE BREATHING/ HYPER/ NERVOUSNESS  . Aciphex (Rabeprazole Sodium) Other (See Comments)    AGGETATION  . Ancef (Cefazolin Sodium) Nausea Only  . Aspirin Other (See Comments)    GI UPSET  . Augmentin (Amoxicillin-Pot Clavulanate) Other (See Comments)    GI UPSET  . Celebrex (Celecoxib) Other (See Comments)    GI UPSET  . Ciprofloxacin Other (See Comments)    CONFUSION  . Dilaudid (Hydromorphone Hcl) Other (See Comments)    CONFUSION  . Keflex (Cephalexin) Other (See Comments)    GI UPSET  . Librax (Clidinium-Chlordiazepoxide) Other (See Comments)  URINARY RETENTION  . Methadone Swelling and Other (See Comments)    TONGUE SWELLING/  BRADYCARDIA (HR IN THE 30'S)  . Morphine And Related Other (See Comments)    HALLUCINATIONS  . Neurontin (Gabapentin) Other (See Comments)    GI UPSET  . Nitrofuran Derivatives Other (See Comments)    CONFUSION/ ANXIOUS  . Nutritional Supplements     Any anti-inflammatory medication  . Protonix (Pantoprazole Sodium) Diarrhea and Other (See Comments)    SEVERE STOMACHE PAIN  . Robaxin (Methocarbamol) Other (See Comments)    GI UPSET  . Toradol (Ketorolac Tromethamine) Other (See Comments)    HYPER AND NERVOUSNESS  . Urispas (Flavoxate Hcl) Other (See Comments)    URETHRAL PAIN  . Valacyclovir Other (See Comments)    URINARY  RETENTION  . Penicillins Rash    No prescriptions prior to admission    No results found for this or any previous visit (from the past 48 hour(s)). No results found.  Review of Systems  Constitutional: Negative.   HENT: Negative.   Eyes: Negative.   Respiratory: Negative.   Cardiovascular: Negative.   Gastrointestinal: Negative.   Genitourinary: Negative.   Musculoskeletal: Negative.   Skin: Positive for rash.       Vulvar rash / erythema  Neurological: Negative.   Endo/Heme/Allergies: Negative.   Psychiatric/Behavioral: Negative.     Height 5\' 8"  (1.727 m), weight 58.514 kg (129 lb). Physical Exam  Constitutional: She is oriented to person, place, and time. She appears well-developed and well-nourished.  HENT:  Head: Normocephalic and atraumatic.  Eyes: EOM are normal. Pupils are equal, round, and reactive to light.  Neck: Normal range of motion. Neck supple.  Cardiovascular: Normal rate.   Respiratory: Effort normal.  GI: Soft. Bowel sounds are normal.  Genitourinary:  No CVAT  Musculoskeletal: Normal range of motion.  Neurological: She is alert and oriented to person, place, and time.  Skin: Skin is warm and dry.  Psychiatric: She has a normal mood and affect. Her behavior is normal. Judgment and thought content normal.     Assessment/Plan 1 - Female Pelvic Pain / Vulvar Pain / Vulvar Erythema -  Do NOT suspect interstitial cystitis / bladder / urethral pain based on exam during which pt clearly localizes discomfort to her erythematous vulva. She describes persistant pain despite use of premarin in this location as well in addition to rounds of antifunfungals and changes in soaps.  I feel this is likley discomfort from skin of the external genetalia. We re-discussed operative cysto and vulvar biopsy to r/o tumor, lichen sclerosis, or other and she wants to proceed. Risks including bleeding, infection, non-cure, non-diagnosis as well as rare risks such as DVT, PE,  MI, CVA, Mortality discussed.    Floye Fesler 06/12/2012, 6:09 AM

## 2012-06-12 NOTE — Anesthesia Postprocedure Evaluation (Signed)
  Anesthesia Post-op Note  Patient: Kristina West  Procedure(s) Performed: Procedure(s) (LRB): CYSTOSCOPY WITH BIOPSY (N/A) VULVAR BIOPSY (N/A)  Patient Location: PACU  Anesthesia Type: General  Level of Consciousness: awake and alert   Airway and Oxygen Therapy: Patient Spontanous Breathing  Post-op Pain: mild  Post-op Assessment: Post-op Vital signs reviewed, Patient's Cardiovascular Status Stable, Respiratory Function Stable, Patent Airway and No signs of Nausea or vomiting  Last Vitals:  Filed Vitals:   06/12/12 0945  BP: 150/71  Pulse: 60  Temp:   Resp: 14    Post-op Vital Signs: stable   Complications: No apparent anesthesia complications

## 2012-06-12 NOTE — Transfer of Care (Signed)
Immediate Anesthesia Transfer of Care Note  Patient: Kristina West  Procedure(s) Performed: Procedure(s) (LRB): CYSTOSCOPY WITH BIOPSY (N/A) VULVAR BIOPSY (N/A)  Patient Location: PACU  Anesthesia Type: General  Level of Consciousness: awake, alert  and oriented  Airway & Oxygen Therapy: Patient Spontanous Breathing and Patient connected to face mask oxygen  Post-op Assessment: Report given to PACU RN and Post -op Vital signs reviewed and stable  Post vital signs: Reviewed and stable  Complications: No apparent anesthesia complications

## 2012-06-12 NOTE — Anesthesia Procedure Notes (Signed)
Procedure Name: LMA Insertion Date/Time: 06/12/2012 8:37 AM Performed by: Renella Cunas D Pre-anesthesia Checklist: Patient identified, Emergency Drugs available, Suction available and Patient being monitored Patient Re-evaluated:Patient Re-evaluated prior to inductionOxygen Delivery Method: Circle System Utilized Preoxygenation: Pre-oxygenation with 100% oxygen Intubation Type: IV induction Ventilation: Mask ventilation without difficulty LMA: LMA inserted LMA Size: 4.0 Number of attempts: 1 Airway Equipment and Method: bite block Placement Confirmation: positive ETCO2 Tube secured with: Tape Dental Injury: Teeth and Oropharynx as per pre-operative assessment

## 2012-06-12 NOTE — Anesthesia Preprocedure Evaluation (Addendum)
Anesthesia Evaluation  Patient identified by MRN, date of birth, ID band Patient awake    Reviewed: Allergy & Precautions, H&P , NPO status , Patient's Chart, lab work & pertinent test results  Airway Mallampati: II TM Distance: >3 FB Neck ROM: full    Dental  (+) Caps and Dental Advisory Given All upper front are capped:   Pulmonary neg pulmonary ROS,  breath sounds clear to auscultation  Pulmonary exam normal       Cardiovascular Exercise Tolerance: Good hypertension, Pt. on medications Rhythm:regular Rate:Normal     Neuro/Psych negative neurological ROS  negative psych ROS   GI/Hepatic negative GI ROS, Neg liver ROS, GERD-  Medicated and Controlled,  Endo/Other  negative endocrine ROS  Renal/GU negative Renal ROS  negative genitourinary   Musculoskeletal   Abdominal   Peds  Hematology negative hematology ROS (+)   Anesthesia Other Findings   Reproductive/Obstetrics negative OB ROS                          Anesthesia Physical Anesthesia Plan  ASA: II  Anesthesia Plan: General   Post-op Pain Management:    Induction: Intravenous  Airway Management Planned: LMA  Additional Equipment:   Intra-op Plan:   Post-operative Plan:   Informed Consent: I have reviewed the patients History and Physical, chart, labs and discussed the procedure including the risks, benefits and alternatives for the proposed anesthesia with the patient or authorized representative who has indicated his/her understanding and acceptance.   Dental Advisory Given  Plan Discussed with: CRNA and Surgeon  Anesthesia Plan Comments:         Anesthesia Quick Evaluation

## 2012-06-12 NOTE — Brief Op Note (Signed)
06/12/2012  9:03 AM  PATIENT:  Erroll Luna  79 y.o. female  PRE-OPERATIVE DIAGNOSIS:  Vulvar Erythema and Pain, dysuria  POST-OPERATIVE DIAGNOSIS:  Vulvar Erythema and Pain, dysuria  PROCEDURE:  Procedure(s) with comments: CYSTOSCOPY WITH BIOPSY (N/A) -   VULVAR BIOPSY   VULVAR BIOPSY (N/A)  SURGEON:  Surgeon(s) and Role:    * Sebastian Ache, MD - Primary  PHYSICIAN ASSISTANT:   ASSISTANTS: none   ANESTHESIA:   local and general  EBL:  Total I/O In: 100 [I.V.:100] Out: -   BLOOD ADMINISTERED:none  DRAINS: none   LOCAL MEDICATIONS USED:  MARCAINE     SPECIMEN:  Source of Specimen:  Rt and Left Vulva skin  DISPOSITION OF SPECIMEN:  PATHOLOGY  COUNTS:  YES  TOURNIQUET:  * No tourniquets in log *  DICTATION: .Other Dictation: Dictation Number [id number]  PLAN OF CARE: Discharge to home after PACU  PATIENT DISPOSITION:  PACU - hemodynamically stable.   Delay start of Pharmacological VTE agent (>24hrs) due to surgical blood loss or risk of bleeding: no

## 2012-06-13 ENCOUNTER — Encounter (HOSPITAL_BASED_OUTPATIENT_CLINIC_OR_DEPARTMENT_OTHER): Payer: Self-pay | Admitting: Urology

## 2012-06-13 NOTE — Op Note (Signed)
NAMEKARSYNN, Kristina West NO.:  [id number]  MEDICAL RECORD NO.:  [id number]  LOCATION:                                 FACILITY:  PHYSICIAN:  Sebastian Ache, MD          DATE OF BIRTH:  DATE OF PROCEDURE:  06/12/2012 DATE OF DISCHARGE:                              OPERATIVE REPORT   DIAGNOSES: 1. Dysuria. 2. Refractory vulvar pain, itching, and erythema.  PROCEDURES: 1. Cystoscopy. 2. Bilateral vulvar biopsy.  COMPLICATIONS:  None.  SPECIMEN:  Right and left vulvar skin biopsy.  FINDINGS: 1. Unremarkable urinary bladder. 2. Erythema and roughness of bilateral vulvar skin without focal     lesions.  INDICATIONS:  [name] is a pleasant 79 year old lady with history of refractory vaginal itching and vulvar pain, as well as some dysuria. She was found on August exam to have discomfort and rought erythema that mostly localized to her vulva.  She has undergone multiple rounds of empirical creams including topical estrogens, antifungals, as well as topical steroids without relief.  She does have a remote smoking history.  This clinical situation was somewhat concerning for possible squamous cell carcinoma versus lichen sclerosus or other. Procedure offered was operative biopsy to help with further diagnostics and she wished to proceed.  Informed consent was obtained and placed in medical record.  PROCEDURE IN DETAIL:  The patient being Kristina West and procedure being cystoscopy and vulvar biopsy was confirmed.  Procedure was carried out.  Time-out was performed.  Intravenous antibiotics administered. General LMA anesthesia was introduced.  The patient was placed into a low lithotomy position.  Sterile field was created by prepping and draping the patient's vagina, introitus, and proximal thighs using iodine x3.  Next, cystourethroscopy was performed using a 22-French cystoscope with 12-degree offset lens.  Inspection of urinary bladder revealed no  diverticula, calcifications, papular lesions.  There was no erythema or lesions, suspicious for carcinoma in situ, either.  Bladder was emptied per cystoscope.  Attention was then directed to vulvar biopsy.  First, on the left side, a small ellipse of skin approximately 7 mm x 3 mm was excised from the inner aspect of the vulva, retaining skin and some amount of subcutaneous tissue.  Point cautery was used for hemostasis.  The skin edges were reapproximated using interrupted 4-0 Monocryl x2.  A mirror-image biopsy was then performed on the right vulva which was then reapproximated using interrupted Monocryl similarly.  Dermabond was applied to the sites. Hemostasis appeared excellent.  Sponge and needle counts were correct. Procedure was then terminated.  The patient tolerated the procedure well.  There were no immediate perioperative complications.  The patient was taken to the postanesthesia care unit in stable condition.          ______________________________ Sebastian Ache, MD     TM/MEDQ  D:  06/12/2012  T:  06/12/2012  Job:  [id number]

## 2012-07-12 ENCOUNTER — Ambulatory Visit
Admission: RE | Admit: 2012-07-12 | Discharge: 2012-07-12 | Disposition: A | Discharge status: Home / Self Care | Payer: Medicare Other | Source: Ambulatory Visit | Attending: Surgery | Admitting: Surgery

## 2012-08-06 ENCOUNTER — Encounter (INDEPENDENT_AMBULATORY_CARE_PROVIDER_SITE_OTHER): Payer: Self-pay | Admitting: Surgery

## 2012-08-06 ENCOUNTER — Ambulatory Visit (INDEPENDENT_AMBULATORY_CARE_PROVIDER_SITE_OTHER): Payer: Medicare Other | Admitting: Surgery

## 2012-08-06 VITALS — BP 130/62 | HR 80 | Temp 97.3°F | Resp 16 | Ht 68.0 in | Wt 138.0 lb

## 2012-08-06 DIAGNOSIS — E042 Nontoxic multinodular goiter: Secondary | ICD-10-CM

## 2012-08-06 DIAGNOSIS — R1310 Dysphagia, unspecified: Secondary | ICD-10-CM | POA: Insufficient documentation

## 2012-08-06 NOTE — Progress Notes (Signed)
General Surgery [facility]  Visit Diagnoses: 1. Multinodular goiter (nontoxic)   2. Dysphagia, unspecified     HISTORY: Patient is a 79 year old white female followed for multinodular goiter. Patient has been followed for a number of years with bilateral thyroid nodules. Dominant nodules have undergone fine-needle aspiration in the past which show follicular lesions without evidence of malignancy. Patient returns today for scheduled followup.  At my request the patient underwent a thyroid ultrasound. This shows a minimally enlarged thyroid gland with the right lobe measuring 4.7 cm and left lobe measuring 4.8 cm. There are multiple bilateral nodules. These are recorded in the present study. They have waxed and waned slightly in size but there is no significant change compared to prior examinations.  Patient continues to note dysphagia, particularly with medications. She was evaluated in April 2013 at our request by speech language pathology. Changes in swallowing were felt to be related to her previous cervical spine procedures and placement of hardware. She was given recommendations for swallowing and written information on techniques for taking medications in particular. She notes that her symptoms are somewhat improved this year.  PERTINENT REVIEW OF SYSTEMS: Denies tremor. Denies palpitations. Persistent complaints of dysphagia, especially when taking medications. No new masses.  EXAM: HEENT: normocephalic; pupils equal and reactive; sclerae clear; dentition fair; mucous membranes moist NECK:  Well-healed anterior and posterior cervical incisions consistent with previous spine procedures; left thyroid lobe is slightly firm, nodular, without discrete or dominant mass; right lobe appears slightly smaller without dominant or discrete mass; symmetric on extension; no palpable anterior or posterior cervical lymphadenopathy; no supraclavicular masses; no  tenderness CHEST: clear to auscultation bilaterally without rales, rhonchi, or wheezes CARDIAC: regular rate and rhythm without significant murmur; peripheral pulses are full EXT:  non-tender without edema; no deformity NEURO: no gross focal deficits; no sign of tremor   IMPRESSION: #1 small multinodular thyroid goiter, clinically stable #2 chronic dysphagia, slightly improved with implementation of speech pathology recommendations  PLAN: I discussed the above findings with the patient and her husband. I do not think she requires repeat fine-needle aspiration biopsy as the dominant nodules that have only changed in size by a couple of millimeters. There are no other worrisome findings and no development of calcifications or dramatic changes in shape. I have asked her to undergo a repeat thyroid ultrasound in 2 years. I will see her back after that study for physical examination.  Patient is having laboratory studies performed next week for her primary care physician. We will add a TSH level to that laboratory panel.  Velora Heckler, MD, [facility] Office: [phone]

## 2012-08-06 NOTE — Patient Instructions (Signed)

## 2012-08-10 ENCOUNTER — Telehealth (INDEPENDENT_AMBULATORY_CARE_PROVIDER_SITE_OTHER): Payer: Self-pay

## 2012-08-10 NOTE — Telephone Encounter (Signed)
Labs here and to Dr Gerkin for review. 

## 2012-08-16 ENCOUNTER — Telehealth (INDEPENDENT_AMBULATORY_CARE_PROVIDER_SITE_OTHER): Payer: Self-pay

## 2012-08-16 NOTE — Telephone Encounter (Signed)
Per Dt Gerkin's request pt notified of lab result and no need to adjust thyroid med at this time.

## 2012-08-16 NOTE — Telephone Encounter (Signed)
Message copied by Joanette Gula on Thu Aug 16, 2012  9:22 AM ------      Message from: Velora Heckler      Created: Thu Aug 16, 2012  9:11 AM       Cindy:            Please tell patient her TSH level is normal.  She does not require thyroid medication.  Will see in office in two years.            tmg       ------

## 2012-08-24 ENCOUNTER — Encounter (INDEPENDENT_AMBULATORY_CARE_PROVIDER_SITE_OTHER): Payer: Self-pay

## 2012-09-26 ENCOUNTER — Encounter: Payer: Self-pay | Admitting: *Deleted

## 2012-09-26 DIAGNOSIS — I6529 Occlusion and stenosis of unspecified carotid artery: Secondary | ICD-10-CM | POA: Insufficient documentation

## 2012-12-10 ENCOUNTER — Ambulatory Visit: Payer: Self-pay | Admitting: Internal Medicine

## 2012-12-20 ENCOUNTER — Encounter: Payer: Self-pay | Admitting: *Deleted

## 2012-12-25 ENCOUNTER — Encounter: Payer: Self-pay | Admitting: General Surgery

## 2012-12-25 ENCOUNTER — Ambulatory Visit (INDEPENDENT_AMBULATORY_CARE_PROVIDER_SITE_OTHER): Payer: Medicare Other | Admitting: General Surgery

## 2012-12-25 VITALS — BP 130/70 | HR 70 | Resp 14 | Ht 68.0 in | Wt 140.0 lb

## 2012-12-25 DIAGNOSIS — I6529 Occlusion and stenosis of unspecified carotid artery: Secondary | ICD-10-CM

## 2012-12-25 DIAGNOSIS — Z1239 Encounter for other screening for malignant neoplasm of breast: Secondary | ICD-10-CM | POA: Insufficient documentation

## 2012-12-25 DIAGNOSIS — N6019 Diffuse cystic mastopathy of unspecified breast: Secondary | ICD-10-CM

## 2012-12-25 NOTE — Progress Notes (Signed)
Patient ID: Kristina West, female   DOB: 1932/05/16, 79 y.o.   MRN: [id number]  Chief Complaint  Patient presents with  . Follow-up    mammogram and carotid     HPI Kristina West is a 79 y.o. female who presents for a breast evaluation. The most recent mammogram was done on 12/16/12. Patient does perform regular self breast checks and gets regular mammograms done.  Patient also here  F/u carotid doppler-small plaque on right side. No neurological symptoms.  HPI  Past Medical History  Diagnosis Date  . Hypertension   . Night sweats   . IBS (irritable bowel syndrome)   . Arthritis     spinal  . Hearing loss BILATERAL HEARING AIDS  . GERD (gastroesophageal reflux disease)   . Dysphasia SOLID FOODS SECONDARY TO  CERVICAL FUSIONS  . Multinodular goiter THYROID--  FOLLOWED BY DR Gerrit Friends  . Multilevel spondylosis   . Spinal stenosis   . OSA on CPAP   . Erythema VULVAR  . Scoliosis   . Chronic gastritis   . History of gastric ulcer   . Occlusion and stenosis of carotid artery without mention of cerebral infarction 2013  . Diffuse cystic mastopathy 2013  . Mitral valve prolapse 2005    Past Surgical History  Procedure Laterality Date  . Rotator cuff repair  2011    LEFT, Dr. Chaney Born in [place]  . Multiple cervical spine surgery's  1977  --  LAST ONE 2011    DISKECTOMY/ LAMINECTOMY/ FUSION'S  C1  --  C7  . Lumbar spine surgery's  X3  LAST ONE 2008    DISKECTOMY/ FUSION'S   OF L3  -  S1  . Bilateral salpingoophectomy  2005  . Cholecystectomy  1996  . Cystoscopy  X3  1976; 1999; 2005    REMOVAL POLYPS/ CAUTERIZATION BLADDER ULCERS  . Esophagogastroduodenoscopy endoscopy  MULTIPLE    GASTRIC ULCER 1998/  CHRONIC GASTRITIS/  ESOPHAGEAL DILATION  . Appendectomy  1943  . Cystoscopy with biopsy N/A 06/12/2012    Procedure: CYSTOSCOPY WITH BIOPSY;  Surgeon: Sebastian Ache, MD;  Location: [facility];  Service: Urology;  Laterality: N/A;    VULVAR  BIOPSY    . Vulva /perineum biopsy N/A 06/12/2012    Procedure: VULVAR BIOPSY;  Surgeon: Sebastian Ache, MD;  Location: [facility];  Service: Urology;  Laterality: N/A;  . Colonoscopy  2009, 2006, 1997    Dr. Ricki Rodriguez  . Vaginal hysterectomy  1978  . Stomach surgery  1997    Family History  Problem Relation Age of Onset  . Parkinsonism Brother   . Alzheimer's disease Brother   . Cancer Brother     lower gums  . Cancer Paternal Aunt 57    breast cancer    Social History History  Substance Use Topics  . Smoking status: Former Smoker    Types: Cigarettes    Quit date: 07/26/1991  . Smokeless tobacco: Never Used  . Alcohol Use: No    Allergies  Allergen Reactions  . Sulfamethoxazole Shortness Of Breath    CHEST PAIN  . Vioxx [Rofecoxib] Shortness Of Breath and Other (See Comments)    TROUBLE BREATHING/ HYPER/ NERVOUSNESS  . Aciphex [Rabeprazole Sodium] Other (See Comments)    AGGETATION  . Ancef [Cefazolin Sodium] Nausea Only  . Aspirin Other (See Comments)    GI UPSET  . Augmentin [Amoxicillin-Pot Clavulanate] Other (See Comments)    GI UPSET  . Celebrex [Celecoxib] Other (  See Comments)    GI UPSET  . Ciprofloxacin Other (See Comments)    CONFUSION  . Dilaudid [Hydromorphone Hcl] Other (See Comments)    CONFUSION  . Keflex [Cephalexin] Other (See Comments)    GI UPSET  . Librax [Clidinium-Chlordiazepoxide] Other (See Comments)    URINARY RETENTION  . Methadone Swelling and Other (See Comments)    TONGUE SWELLING/  BRADYCARDIA (HR IN THE 30'S)  . Morphine And Related Other (See Comments)    HALLUCINATIONS  . Neurontin [Gabapentin] Other (See Comments)    GI UPSET  . Nitrofuran Derivatives Other (See Comments)    CONFUSION/ ANXIOUS  . Nutritional Supplements     Any anti-inflammatory medication  . Protonix [Pantoprazole Sodium] Diarrhea and Other (See Comments)    SEVERE STOMACHE PAIN  . Robaxin [Methocarbamol] Other (See Comments)    GI  UPSET  . Toradol [Ketorolac Tromethamine] Other (See Comments)    HYPER AND NERVOUSNESS  . Urispas [Flavoxate Hcl] Other (See Comments)    URETHRAL PAIN  . Valacyclovir Other (See Comments)    URINARY RETENTION  . Penicillins Rash    Current Outpatient Prescriptions  Medication Sig Dispense Refill  . amLODipine (NORVASC) 5 MG tablet Take 5 mg by mouth every morning.       Marland Kitchen azelastine (ASTELIN) 137 MCG/SPRAY nasal spray Place 1 spray into the nose as needed. Use in each nostril as directed      . Calcium Carbonate-Vitamin D 600-400 MG-UNIT per tablet Take 1 tablet by mouth 2 (two) times daily.       . Cholecalciferol (D3-1000 PO) Take 1 capsule by mouth daily.      Marland Kitchen esomeprazole (NEXIUM) 40 MG capsule Take 40 mg by mouth 2 (two) times daily.       . Polyethylene Glycol 3350 (MIRALAX PO) Take by mouth as needed.       Marland Kitchen PREMARIN vaginal cream Place vaginally 2 (two) times a week.       . Probiotic Product (PHILLIPS COLON HEALTH) CAPS Take 1 capsule by mouth daily.       . ramipril (ALTACE) 10 MG capsule Take 10 mg by mouth every morning.       . Simethicone (PHAZYME) 180 MG CAPS Take 1 capsule by mouth 2 (two) times daily.       No current facility-administered medications for this visit.    Review of Systems Review of Systems  Constitutional: Negative.   Respiratory: Negative.   Cardiovascular: Negative.     Blood pressure 130/70, pulse 70, resp. rate 14, height 5\' 8"  (1.727 m), weight 140 lb (63.504 kg).  Physical Exam Physical Exam  Constitutional: She is oriented to person, place, and time. She appears well-developed and well-nourished.  Eyes: No scleral icterus.  Neck: No tracheal deviation present. No mass and no thyromegaly present.  Cardiovascular: Normal rate, regular rhythm and normal heart sounds.   Pulses:      Carotid pulses are 2+ on the right side, and 2+ on the left side.      Dorsalis pedis pulses are 2+ on the right side, and 2+ on the left side.        Posterior tibial pulses are 2+ on the right side, and 2+ on the left side.  No edema, no vv. No skin changes. Feet warm, good capillary refill.  Pulmonary/Chest: Breath sounds normal. She exhibits no tenderness. Right breast exhibits no inverted nipple, no mass, no nipple discharge, no skin change and no tenderness. Left breast  exhibits no inverted nipple, no mass, no nipple discharge, no skin change and no tenderness.  Abdominal: Soft. Bowel sounds are normal. There is no hepatomegaly. There is no tenderness. No hernia.  Lymphadenopathy:    She has no cervical adenopathy.    She has no axillary adenopathy.  Neurological: She is oriented to person, place, and time.  Skin: Skin is dry.    Data Reviewed Mammogram reviewed -stable  Assessment    Stable breast exam and carotid doppler    Plan    F/u in 1 yr      Procedure: bilateral carotid duplex study There is a smooth 0.5mm thick, 1.8cm long plaque occupying the right CCA and bifurcation. No associated stenosis noted.  Left side is normal. Doppler flows are normal. ICA/CCA ratio is 1.03 on right and < 1 on left. Both vertebral arteries have antegrade flow.  Kaya Klausing G 12/25/2012, 1:58 PM

## 2013-01-16 ENCOUNTER — Encounter: Payer: Self-pay | Admitting: General Surgery

## 2013-03-01 ENCOUNTER — Ambulatory Visit: Payer: Self-pay | Admitting: Gastroenterology

## 2013-03-02 ENCOUNTER — Inpatient Hospital Stay: Payer: Self-pay | Admitting: Internal Medicine

## 2013-03-02 LAB — PROTIME-INR: INR: 1

## 2013-03-02 LAB — URINALYSIS, COMPLETE
Ketone: NEGATIVE
Nitrite: NEGATIVE
Ph: 5 (ref 4.5–8.0)
Protein: NEGATIVE
Specific Gravity: 1.008 (ref 1.003–1.030)
Squamous Epithelial: NONE SEEN
WBC UR: NONE SEEN /HPF (ref 0–5)

## 2013-03-02 LAB — CBC WITH DIFFERENTIAL/PLATELET
Basophil %: 0.4 %
HGB: 12.8 g/dL (ref 12.0–16.0)
Lymphocyte #: 2.6 10*3/uL (ref 1.0–3.6)
Lymphocyte %: 36.4 %
MCH: 30.8 pg (ref 26.0–34.0)
MCHC: 34.2 g/dL (ref 32.0–36.0)
MCV: 90 fL (ref 80–100)
RDW: 12.7 % (ref 11.5–14.5)
WBC: 7 10*3/uL (ref 3.6–11.0)

## 2013-03-02 LAB — COMPREHENSIVE METABOLIC PANEL
BUN: 8 mg/dL (ref 7–18)
Chloride: 105 mmol/L (ref 98–107)
Co2: 29 mmol/L (ref 21–32)
EGFR (African American): >60
Glucose: 98 mg/dL (ref 65–99)

## 2013-03-02 LAB — MAGNESIUM: Magnesium: 1.5 mg/dL — ABNORMAL LOW

## 2013-03-02 LAB — HEMOGLOBIN: HGB: 12 g/dL (ref 12.0–16.0)

## 2013-03-03 LAB — CBC WITH DIFFERENTIAL/PLATELET
Basophil %: 0.9 %
Eosinophil #: 0 10*3/uL (ref 0.0–0.7)
Eosinophil #: 0 10*3/uL (ref 0.0–0.7)
Eosinophil %: 0.5 %
HCT: 38.9 % (ref 35.0–47.0)
HGB: 13.7 g/dL (ref 12.0–16.0)
Lymphocyte #: 2.4 10*3/uL (ref 1.0–3.6)
Lymphocyte #: 4.2 10*3/uL — ABNORMAL HIGH (ref 1.0–3.6)
Lymphocyte %: 37 %
MCV: 90 fL (ref 80–100)
MCV: 90 fL (ref 80–100)
Monocyte #: 0.6 x10 3/mm (ref 0.2–0.9)
Monocyte %: 8.3 %
Neutrophil #: 3.8 10*3/uL (ref 1.4–6.5)
Neutrophil #: 6.1 10*3/uL (ref 1.4–6.5)
Platelet: 246 10*3/uL (ref 150–440)
RBC: 3.85 10*6/uL (ref 3.80–5.20)
RBC: 4.33 10*6/uL (ref 3.80–5.20)
RDW: 12.4 % (ref 11.5–14.5)
WBC: 11.5 10*3/uL — ABNORMAL HIGH (ref 3.6–11.0)

## 2013-03-03 LAB — PROTIME-INR: Prothrombin Time: 13.1 secs (ref 11.5–14.7)

## 2013-03-03 LAB — COMPREHENSIVE METABOLIC PANEL
Albumin: 3.2 g/dL — ABNORMAL LOW (ref 3.4–5.0)
Calcium, Total: 8.2 mg/dL — ABNORMAL LOW (ref 8.5–10.1)
Glucose: 93 mg/dL (ref 65–99)
SGOT(AST): 25 U/L (ref 15–37)
SGPT (ALT): 20 U/L (ref 12–78)
Sodium: 141 mmol/L (ref 136–145)

## 2013-03-04 LAB — CBC WITH DIFFERENTIAL/PLATELET
Basophil #: 0 10*3/uL (ref 0.0–0.1)
Basophil %: 0.6 %
Eosinophil %: 0.2 %
HGB: 12.1 g/dL (ref 12.0–16.0)
HGB: 12.6 g/dL (ref 12.0–16.0)
Lymphocyte #: 3.4 10*3/uL (ref 1.0–3.6)
Lymphocyte #: 3.8 10*3/uL — ABNORMAL HIGH (ref 1.0–3.6)
Lymphocyte %: 43.4 %
Lymphocyte %: 43.8 %
MCH: 31.3 pg (ref 26.0–34.0)
MCV: 90 fL (ref 80–100)
Monocyte #: 0.7 x10 3/mm (ref 0.2–0.9)
Monocyte #: 0.7 x10 3/mm (ref 0.2–0.9)
Neutrophil #: 3.7 10*3/uL (ref 1.4–6.5)
Platelet: 222 10*3/uL (ref 150–440)

## 2013-03-05 ENCOUNTER — Inpatient Hospital Stay: Payer: Self-pay | Admitting: Internal Medicine

## 2013-03-05 LAB — BASIC METABOLIC PANEL
Anion Gap: 4 — ABNORMAL LOW (ref 7–16)
BUN: 5 mg/dL — ABNORMAL LOW (ref 7–18)
Chloride: 107 mmol/L (ref 98–107)
Co2: 29 mmol/L (ref 21–32)
Creatinine: 0.72 mg/dL (ref 0.60–1.30)
EGFR (African American): >60
EGFR (Non-African Amer.): >60
Glucose: 85 mg/dL (ref 65–99)

## 2013-03-05 LAB — CBC WITH DIFFERENTIAL/PLATELET
Basophil %: 0.8 %
HGB: 12.6 g/dL (ref 12.0–16.0)
Lymphocyte %: 49 %
Monocyte %: 7.4 %
Neutrophil %: 41.7 %
RBC: 3.97 10*6/uL (ref 3.80–5.20)
WBC: 8.2 10*3/uL (ref 3.6–11.0)

## 2013-03-06 LAB — CBC WITH DIFFERENTIAL/PLATELET
Basophil #: 0 10*3/uL (ref 0.0–0.1)
Eosinophil %: 1.2 %
HGB: 12.3 g/dL (ref 12.0–16.0)
Lymphocyte #: 3.5 10*3/uL (ref 1.0–3.6)
Lymphocyte %: 49.5 %
MCHC: 35.1 g/dL (ref 32.0–36.0)
Monocyte #: 0.5 x10 3/mm (ref 0.2–0.9)
Monocyte %: 6.8 %
Neutrophil #: 3 10*3/uL (ref 1.4–6.5)
Neutrophil %: 41.9 %
RDW: 12.3 % (ref 11.5–14.5)

## 2013-03-06 LAB — BASIC METABOLIC PANEL
BUN: 4 mg/dL — ABNORMAL LOW (ref 7–18)
Co2: 29 mmol/L (ref 21–32)
Creatinine: 0.62 mg/dL (ref 0.60–1.30)
EGFR (African American): >60
EGFR (Non-African Amer.): >60
Osmolality: 279 (ref 275–301)
Potassium: 3.3 mmol/L — ABNORMAL LOW (ref 3.5–5.1)

## 2013-05-17 ENCOUNTER — Encounter: Payer: Self-pay | Admitting: Obstetrics and Gynecology

## 2013-08-21 ENCOUNTER — Ambulatory Visit: Payer: Self-pay | Admitting: Gastroenterology

## 2013-12-20 ENCOUNTER — Encounter: Payer: Self-pay | Admitting: General Surgery

## 2014-01-01 ENCOUNTER — Ambulatory Visit: Payer: Medicare Other | Admitting: General Surgery

## 2014-01-16 ENCOUNTER — Encounter: Payer: Self-pay | Admitting: *Deleted

## 2014-03-03 ENCOUNTER — Encounter: Payer: Self-pay | Admitting: General Surgery

## 2014-05-20 IMAGING — CR DG CHEST 1V PORT
1 series · 1 of 1 positions shown · non-contrast
Comparison: none

REASON FOR EXAM: CP
COMMENTS:

PROCEDURE:     DXR - DXR PORTABLE CHEST SINGLE VIEW  - May 25, 2012 [DATE]
RESULT:     Comparison: 05/12/2011

[ap]
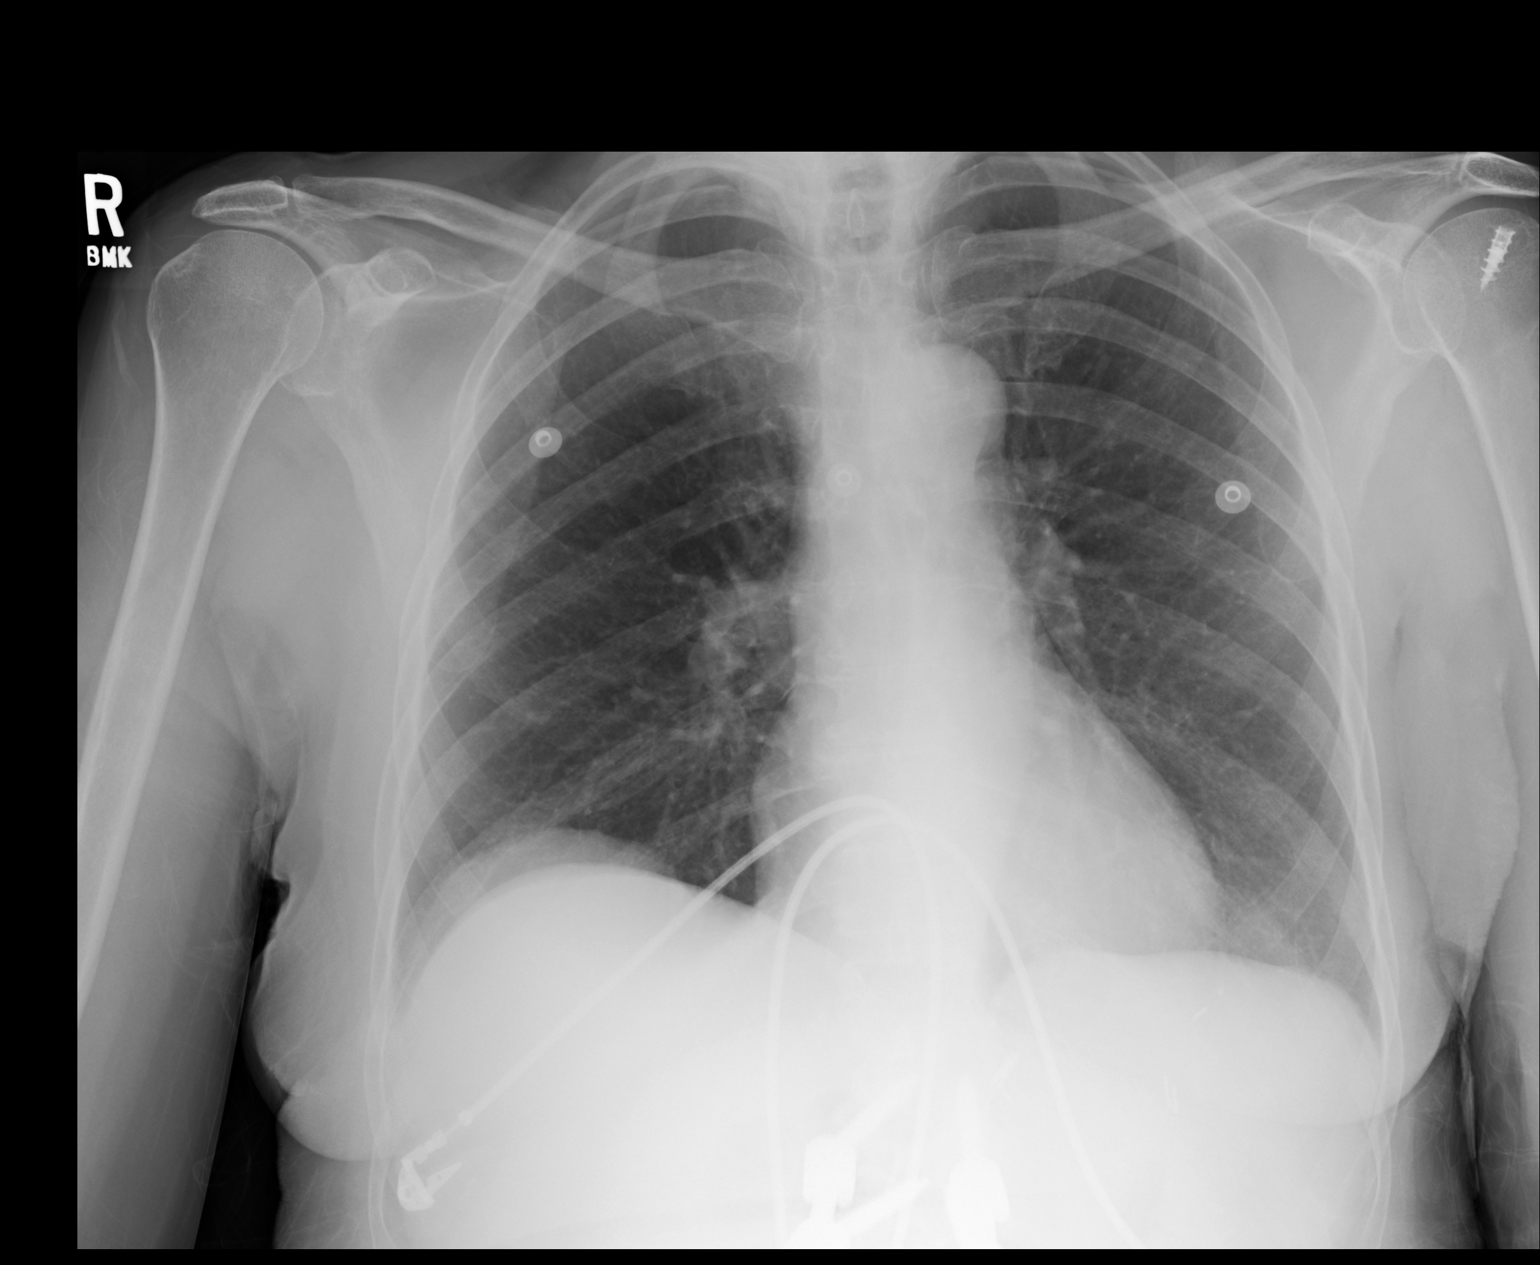

[1 of 1 positions shown; findings below may reference images not displayed]

FINDINGS: The heart and mediastinum are stable. No focal pulmonary opacities.
IMPRESSION: No acute cardiopulmonary disease.

## 2014-08-22 NOTE — Discharge Summary (Signed)
PATIENT NAME:  Kristina West, Kristina West MR#:  [id number] DATE OF BIRTH:  October 30, 1932  DATE OF ADMISSION:  03/05/2013 DATE OF DISCHARGE:  03/06/2013  DIAGNOSES AT THE TIME OF DISCHARGE:  1. Postpolypectomy bleed with acute blood loss anemia.  2. Hypertension.  3. Degenerative joint disease.  4. Gastroesophageal reflux disease.   HISTORY OF PRESENT ILLNESS: Ms. Kristina West is a 81 year old female who had undergone colonoscopy on 03/01/2013 because of personal history of polyps, and during the colonoscopy, was found to have a large polyp of 30 mm in size in the cecum. This was removed with the cold snare, and subsequently had been admitted over the weekend because of bright red blood per rectum, and following her discharge, she returned back to the ER because of persistent bleeding. The patient has been hemodynamically stable, and hemoglobin was also stable.   PAST MEDICAL HISTORY: Significant for gastroesophageal reflux disease, degenerative joint disease, osteoporosis, history of recurrent urinary tract infection, allergic rhinitis, IBS, asthma, atypical chest pain, iron deficiency anemia, B12 deficiency, multinodular goiter.   PHYSICAL EXAMINATION:  VITAL SIGNS: Temperature was 98.5, pulse was 58, respirations 20, blood pressure 146/70.  HEENT: [place], AT.  NECK: Supple.  HEART: S1, S2.  LUNGS: Clear.  ABDOMEN: Soft, nontender.  EXTREMITIES: No edema.   LABORATORY DATA: Hemoglobin 12.6, WBC count 8.7, hematocrit 36.5, platelets 222, MCV was 90.   HOSPITAL COURSE: The patient did not require a blood transfusion. She was seen in consultation by GI, Dr. Marva PandaSkulskie, who advised observing her for a total of 48 hours to make sure she is hemodynamically stable. During her stay in hospital, her hemoglobin remained stable at 12.3 at the time of discharge. She did have small amounts of bleeding that subsequently resolved. Her diet was gradually advanced, and she was discharged in stable condition. She was noted to be  hypokalemic, which was replaced, and the patient was discharged on the following medications.   DISCHARGE MEDICATIONS:  1. Nexium 40 mg b.i.d.  2. Altace 10 mg once a day.  3. Amlodipine 10 mg once a day.  4. Calcium/D3 one tab once a day. 5. Phazyme Ultra 1800 mg b.i.d.  6. Caltrate Plus D 1 tab once a day.  7. Norco 10/325 mg 1 tablet q.i.d. p.r.n.  8. MiraLax 17 grams daily.  9. Premarin vaginal cream twice a week.   FOLLOWUP: The patient has been advised to have a CBC checked in 2 to 3 days and follow with me, Dr. Marcello FennelHande, and also follow up with Dr. Marva PandaSkulskie. I advised to call back with any questions or concerns.   Total time spent in discharge of this pt: 30 minutes  ____________________________ Barbette ReichmannVishwanath Aalyah Mansouri, MD vh:lb D: 03/07/2013 12:50:37 ET T: 03/07/2013 13:09:30 ET JOB#: [id number]  cc: Barbette ReichmannVishwanath Kaylana Fenstermacher, MD, <Dictator> Barbette ReichmannVISHWANATH Madellyn Denio MD ELECTRONICALLY SIGNED 03/08/2013 13:01

## 2014-08-22 NOTE — Consult Note (Signed)
PATIENT NAME:  Kristina West, Kristina West MR#:  [id number] DATE OF BIRTH:  [birth year]  DATE OF CONSULTATION:  03/02/2013  CONSULTING PHYSICIAN:  Kristina Jun, MD  HISTORY OF PRESENT ILLNESS: The patient is a 81 year old white female who had a colonoscopy with a polypectomy yesterday of large polyps in the ascending and cecal area. She had clips placed at the site of the largest one. She woke up in the  middle of the night and had some abdominal discomfort and then passed a small amount of blood.  A couple of hours later woke up and had more blood.  At 6:00 a.m. had a bowel movement with a lot of blood. She was shocked how much there was and at 9:00 a.m. she had cup full with clots. She contacted me while I was doing a procedure and I advised her to go to the Emergency Room immediately. She went by ambulance. She was in the ER and I interviewed and examined her.   PAST MEDICAL HISTORY: 1.  Severe degenerative arthritis of the cervical and lumbar spine, osteoporosis.  2.  Recurrent UTIs.  3.  Allergic rhinitis.  4.  Hyperlipidemia.  5.  Irritable bowel syndrome.  6.  Asthma, possibly related to GERD,  7.  Atypical chest pain with negative cardiac catheterization 2003.  8.  Hypertension.  9.  Iron deficiency anemia.  10.  Diverticulosis.  11.  Chronic pain syndrome with neck and back.  12.  B12 deficiency.   PAST SURGICAL HISTORY: Rectocele repair in 1990, hydrodilatation 1999, cholecystectomy, appendectomy, hysterectomy, multiple surgical fusions, cervical laminectomy, lumbar laminectomy, lumbar fusion, hernia  repair bilaterally, laparoscopic oophorectomy and pelvic mass removed 2005, reconstructive lumbar surgery 2008, cervical fusion 2009.   ALLERGIES: MULTIPLE, AND INCLUDE:  ANTIHISTAMINES, AUGMENTIN, CEPHALEXIN, LEXAPRO, METHADONE,  , NEURONTIN, NONSTEROIDAL ANTI-INFLAMMATORY DRUGS, WHICH CAUSES GASTRITIS, PENICILLIN, PRAVACHOL WHICH GIVES HER MUSCLE SORENESS, SULFA, AND TORADOL.  HABITS:  A  reformed smoker, quit more than 20 years ago. Denies alcohol use.   MEDICATIONS: Altace 10 mg daily, amlodipine 5 mg daily, Astepro 0.5% spray both nostrils as needed, Carafate 2 tsp q.i.d. p.r.n., estradiol 0.01 intravaginal 3 times a week, Lidoderm 5% patch on and off 12 hours, Nasonex 2 sprays both nostrils once a day, Nexium 40 mg once a day, ranitidine 150 mg a day, Zydone 10/40 one tablet q. 4 p.r.n., Flexeril 5 mg daily.   PHYSICAL EXAMINATION: GENERAL: Elderly white female in no acute distress.  VITAL SIGNS:  Blood pressure 180/80, pulse 84.  HEENT: Sclerae anicteric. Conjunctivae negative. Tongue negative.  HEAD: Atraumatic.  CHEST: Clear.  HEART: No murmurs, gallops, clicks, or rubs.  ABDOMEN: Bowel sounds present. No hepatosplenomegaly. No masses. No bruits. No significant tenderness. Old scars noted.  EXTREMITIES: No edema.  SKIN: Warm and dry. The patient complains of some nausea and queasiness. She was given Zofran 4 mg at the end of the examination.    LABORATORY DATA:  Hemoglobin is 12.8, platelet count is 212, liver panel is completely normal. Pro time 13.3. Urinalysis: Turbid urine but otherwise negative.  METB is normal except for potassium 3.4.   ASSESSMENT: Post polypectomy bleed.   PLAN: Have the patient admitted to the hospital. Have hospitalist do this because of her other medical problems. Give her 2 courses of MiraLax to drink as soon as possible, and then perform a colonoscopy hopefully with successful stoppage or any bleeding from polypectomies that were done yesterday.   The patient said that she is on hydrocodone 4  times a day and she would not like to go into withdrawal and asked to continue that in the hospital. This is because of all of her back surgeries that she has had to have.    ____________________________ Kristina Junobert T. Elliott, MD rte:dp D: 03/02/2013 11:52:00 ET T: 03/02/2013 12:48:39 ET JOB#: [id number]  cc: Danella PentonMark F. Miller, MD Christena DeemMartin U. Skulskie,  MD Kristina Junobert T. Elliott, MD, <Dictator>   Kristina JunOBERT T ELLIOTT MD ELECTRONICALLY SIGNED 04/02/2013 15:05

## 2014-08-22 NOTE — Consult Note (Signed)
Chief Complaint:  Subjective/Chief Complaint seen for post-polypectomy bleeding.  doing well, one bm thiws afternoon, last part brown.  no nausea or abdominal pain. tolerating full liquids.   VITAL SIGNS/ANCILLARY NOTES: **Vital Signs.:   04-Nov-14 14:25  Vital Signs Type Routine  Temperature Temperature (F) 98  Celsius 36.6  Temperature Source oral  Pulse Pulse 80  Respirations Respirations 20  Systolic BP Systolic BP 440  Diastolic BP (mmHg) Diastolic BP (mmHg) 75  Mean BP 98  Pulse Ox % Pulse Ox % 98  Pulse Ox Activity Level  At rest  Oxygen Delivery Room Air/ 21 %   Brief Assessment:  Cardiac Regular   Respiratory clear BS   Gastrointestinal details normal Soft  Nontender  Nondistended  No masses palpable  Bowel sounds normal   Lab Results: Routine Chem:  04-Nov-14 04:33   Glucose, Serum 85  BUN  5  Creatinine (comp) 0.72  Sodium, Serum 140  Potassium, Serum  3.4  Chloride, Serum 107  CO2, Serum 29  Calcium (Total), Serum 9.0  Anion Gap  4  Osmolality (calc) 276  eGFR (African American) >60  eGFR (Non-African American) >60 (eGFR values <58mL/min/1.73 m2 may be an indication of chronic kidney disease (CKD). Calculated eGFR is useful in patients with stable renal function. The eGFR calculation will not be reliable in acutely ill patients when serum creatinine is changing rapidly. It is not useful in  patients on dialysis. The eGFR calculation may not be applicable to patients at the low and high extremes of body sizes, pregnant women, and vegetarians.)  Routine Hem:  02-Nov-14 21:20   RDW 12.4  03-Nov-14 05:08   RDW 12.5    13:25   RDW 12.6  04-Nov-14 04:33   WBC (CBC) 8.2  RBC (CBC) 3.97  Hemoglobin (CBC) 12.6  Hematocrit (CBC) 35.4  Platelet Count (CBC) 227  MCV 89  MCH 31.7  MCHC 35.6  RDW 12.6  Neutrophil % 41.7  Lymphocyte % 49.0  Monocyte % 7.4  Eosinophil % 1.1  Basophil % 0.8  Neutrophil # 3.4  Lymphocyte #  4.0  Monocyte # 0.6   Eosinophil # 0.1  Basophil # 0.1 (Result(s) reported on 05 Mar 2013 at 05:59AM.)   Assessment/Plan:  Assessment/Plan:  Assessment 1) post polypectomy bleeding.  stable, not recurrent, tolerating full liquids.   Plan 1) advance diet to low residue in am, continue low residue for one week.  ok to d/c tomorrow am if no evidence of repeat bleeding.  Continue low residue diet for 10 days.  GI o/p visit 2 weeks.  Thank you to IM for your assistance.   Electronic Signatures: Loistine Simas (MD)  (Signed [phone] 17:37)  Authored: Chief Complaint, VITAL SIGNS/ANCILLARY NOTES, Brief Assessment, Lab Results, Assessment/Plan   Last Updated: 04-Nov-14 17:37 by Loistine Simas (MD)

## 2014-08-22 NOTE — H&P (Signed)
PATIENT NAME:  Kristina West, KUEHNE MR#:  [id number] DATE OF BIRTH:  [birth year]  DATE OF ADMISSION:  03/02/2013  PRIMARY CARE PHYSICIAN: Dr. Marcello Fennel   HISTORY OF PRESENT ILLNESS: The patient is a 81 year old Caucasian female with past medical history significant for history of gastroesophageal reflux disease, iron deficiency anemia who underwent a colonoscopy by Dr. Marva Panda on the 03/01/2013. After colonoscopy, she did quite okay; however, upon returning back at home at around 11:00 p.m. she went to the bathroom and she saw dark, bloody-looking discharge from her rectum. It was a very small amount and she was not really sure if it was blood.  Today at around 2:00 a.m. she had a little bit more blood and it looked more reddish, more fresh.  Then later in the morning she had quite a lot of blood in the commode. She thinks that it probably was around of 1/2 of a cup. She had very little stool, which were just small pieces of it.  She felt somewhat lightheaded as well as dizzy, and decided to come to the Emergency Room for further evaluation. She also admitted to having  mild abdominal pains, which she describes as umbilical pain, which was somewhat unusual for her.  It was an achy pain 5 out of 10 in intensity, intermittent and lasted just a few minutes and no alleviating or aggravating factors were noted. It radiates towards her rectum.  In the Emergency Room, she was noted to have a normal hemoglobin level as well as normal vital signs; however, the patient was evaluated by Dr. Mechele Collin, who felt that she needs to be admitted to the hospital for further evaluation and possibly repeated colonoscopy.   PAST MEDICAL HISTORY: Significant for history of atypical chest pain, admission in February 2012, gastroesophageal reflux disease, iron deficiency anemia, diverticulosis, history of chronic cervical as well as lumbar degenerative disk disease. B12 deficiency, chronic abdominal pain, also history of osteoporosis,  urinary tract infection, allergic rhinitis, hyperlipidemia, irritable bowel syndrome, history of asthma, which was felt to be related to gastroesophageal reflux disease, history of cardiac catheterization in 2003 which was negative, history of hypertension, chronic pain syndrome, on chronic pain medications.   PAST SURGICAL HISTORY Rectocele repair in 1990s, hydrodilation 1999, history of cholecystectomy, appendectomy, hysterectomy, multiple surgical fusions, cervical laminectomy, lumbar laminectomy and lumbar fusion, hiatal hernia repair bilaterally, laparoscopic oophorectomy and pelvic mass removal and 2005, reconstructive lumbar surgery in 2008,  cervical fusion in 2009.   ALLERGIES: MULTIPLE INCLUDING ACIPHEX WHICH GIVES AGITATION, ANCEF, ANTI-INFLAMMATORY MEDICINES, ASPIRIN, AUGMENTIN, CELEBREX, CIPROFLOXACIN, WHICH GIVES HER CONFUSION, DILAUDID, KEFLEX, LIBRAX, METHADONE WHICH GIVES HER A SWOLLEN TONGUE AS WELL AS THROAT AND DECREASES HER HEART RATE IN THE 30S. MORPHINE - HALLUCINATIONS, NEURONTIN, NITROFURANTOIN, PENICILLIN, PROTONIX WHICH GIVES HER SEVERE ABDOMINAL PAINS, AS WELL AS DIARRHEA, ROBAXIN, SULFAMETHOXAZOLE WHICH GIVES HER CHEST PAIN AS WELL AS DIFFICULTY BREATHING, TORADOL, URISPAS     FAMILY HISTORY:  Alzheimer dementia in mother, coronary artery disease in father.   SOCIAL HISTORY:  A former smoker, quit approximately 20 years ago. Denies alcohol or recreational drug abuse.   MEDICATIONS: According to the medical record, the patient is on Altace 10 mg p.o. daily, amlodipine 5 mg p.o. daily, Astelin 2 sprays once daily, Caltrate with vitamin D plus multivitamins once daily, Carafate 2 grams 4 times daily. Colon health probiotic capsules, D3 with calcium.  I am not sure if these two are being taking. MiraLax once daily, Nexium 40 mg p.o. twice daily, Norco 10/325 mg  1 tablet 4 times daily, (Dictation Anomaly)  Phazyme Ultra 180 mg p.o. twice daily and Premarin vaginal once weekly.     REVIEW OF SYSTEMS:  Positive for pain in the abdomen. No fevers, chills, weakness, weight loss or gain.   EYES: Denies blurry vision, double vision, glaucoma or cataracts.  EARS, NOSE, AND THROAT:  Denies tinnitus, allergies, epistaxis, sinus pain, admits to some nasal swelling and difficulty breathing at this time since she usually takes Astelin  and she did not have any of her medications today.  RESPIRATION:  Denies any cough, wheezes, asthma, COPD.  CARDIOVASCULAR: Denies chest, orthopnea, arrhythmias, palpitations or syncope. Admits to  dizziness earlier today.  GASTROINTESTINAL:  Denies nausea, vomiting, diarrhea. Admits of having some rectal bleeding earlier today, which was fresh blood in her stool.  Admits to abdominal pain, which is new and around the umbilical area, but lasts only a few minutes.  GENITOURINARY:  Denies any dysuria, hematuria, frequency or  incontinence.  ENDOCRINE: Denies polydipsia, nocturia, thyroid problems, heat or cold intolerance or thirst.   HEMATOLOGIC: Denies anemia, easy bruising, bleeding or swollen glands.  SKIN: Denies any acne, rashes, lesions or change in moles.  MUSCULOSKELETAL: Denies arthritis, cramps, swelling, gout.  NEUROLOGIC:  No numbness, epilepsy or tremor   PSYCHIATRIC: Denies anxiety, insomnia or depression.   PHYSICAL EXAMINATION: VITAL SIGNS: On arrival to the hospital the patient's temperature is not measured. Pulse is 63. respiration was 18, blood pressure 190/90, saturation 98% on room air.  GENERAL: This is a well-developed, well-nourished Caucasian female in no significant distress, sitting on the stretcher.  HEENT: Pupils equal, reactive to light. Extraocular movements are intact. No icterus or conjunctivitis. Has normal hearing. No pharyngeal erythema.  Mucosa is moist.  The patient nasal passages are somewhat swollen. She is having difficulty breathing through her nose.   NECK:  No masses. Supple, nontender.  Thyroid not enlarged.   No adenopathy. No JVD or carotid bruits bilaterally. Full range of motion.  LUNGS: Clear to auscultation in all fields. No rale, rhonchi, diminished breath sounds or wheezing. No labored respirations, increased effort, dullness to percussion or respiratory distress.  CARDIOVASCULAR: S1,  S2 appreciated.  No murmurs, gallops or rubs.  PMI not lateralized.  Chest is nontender to palpation.  EXTREMITIES: 1+ pedal pulses. No lower extremity edema, calf tenderness or cyanosis were noted.  ABDOMEN: Soft minimally tender in the periumbilical as well as suprapubic area.  No hepatosplenomegaly or masses were noted. Rectal was deferred. Bowel sounds are present.  MUSCLE STRENGTH: Able to move all extremities. No cyanosis, degenerative joint disease or kyphosis. Gait is not tested. SKIN:  Did not reveal any rashes, lesions, erythema, nodularity or induration. It was warm and dry to palpation.  LYMPH: No adenopathy in the cervical region.  NEUROLOGICAL: Cranial nerves grossly intact. Sensory intact.  No dysarthria or aphasia.  PSYCHIATRIC: The patient is alert, oriented to time, person and place, cooperative. Memory is somewhat impaired, but no significant confusion, agitation or depression noted.   LABORATORY AND RADIOLOGICAL DATA: BMP showed potassium of 3.4, otherwise unremarkable. Liver enzymes were normal. CBC was within normal limits with white blood cell count 7.0, hemoglobin 12.8, platelet count 212.  Absolute neutrophil count is normal at 4.0. Coagulation panel was normal at 13.3, INR was 1.0.  Urinalysis: Yellow turbid urine, negative for glucose, bilirubin or ketones. Specific gravity 1.008, pH was 5.0, negative for blood, protein, nitrites or leukocyte esterase, no red blood cells or white blood cells, trace  bacteria, no epithelial cells. Amorphous crystals were present. EKG showed normal sinus rhythm at 66 beats per minute. Normal axis. No acute ST-T changes were noted.   ASSESSMENT AND PLAN: 1.   Lower gastrointestinal bleed status post colonoscopy with snare of multiple polyps from cecum including 28 mm polyp, 11 mm as well as 12 mm polyps from the ascending colon. Admit the patient to medical floor. Follow her for gastrointestinal bleed. Continue IV fluids at this time. We will get a gastroenterologist to see patient for further recommendations including colonoscopy.  2.  History of iron deficiency anemia. Will resume iron supplements whenever the patient is re-evaluated by Gastroenterology.  3.  History of gastroesophageal reflux disease. Will continue the patient on PPIs as well as Carafate.  4.  Hypokalemia.  Supplement IV. Get magnesium level.  5.  Malignant hypertension. We will resume the patient's blood pressure medications.  6.  Dizziness. Questionably related to her high blood pressure versus acute gastrointestinal blood loss. We will follow the patient and get orthostatic vital signs.   TIME SPENT: 50 minutes.    ____________________________ Katharina Caperima Tiamarie Furnari, MD rv:dp D: 03/02/2013 12:38:00 ET T: 03/02/2013 13:44:18 ET JOB#: [id number]  cc: Barbette ReichmannVishwanath Hande, MD Katharina Caperima Angelia Hazell, MD, <Dictator>  Lurie Mullane MD ELECTRONICALLY SIGNED 04/02/2013 20:59

## 2014-08-22 NOTE — Consult Note (Signed)
Brief Consult Note: Diagnosis: post polypectomy bleeding.   Patient was seen by consultant.   Consult note dictated.   Recommend to proceed with surgery or procedure.   Comments: Patioent seen and examined, full consult to follow.  Patient well known to me.  Patient with routine colonoscopy done friday with removal of a large cecum polyp, site closed with 3 hemostasis clips.  2 smaller polyps removed.  Patietn with GI bleeding/hematochezia the following day.  Patient had colonoscopy repeated by Dr Mechele CollinElliott the next day, showing one of the smaller polypectomy sites as the source.  Audelia Actonheis was clipped with a hemostasis clip, and patient given dietary instructions.  Had regular dinner, with repeat bleeding and returned , admitted.  Currently hemodynamically stable, labs stable.  Continue clear liquid diet for now.  I would probably observe in the hospital for at learst 48 housr before considering d/c on strict diet limitations.  Following, appreciate IM assistance.  Electronic Signatures for Addendum Section:  Barnetta ChapelSkulskie, Martin (MD) (Signed Addendum [phone]-Nov-14 16:42)  Please see full GI consult [phone]#[id number].   Electronic Signatures: Barnetta ChapelSkulskie, Martin (MD)  (Signed [phone]-Nov-14 12:40)  Authored: Brief Consult Note   Last Updated: 03-Nov-14 16:42 by Barnetta ChapelSkulskie, Martin (MD)

## 2014-08-22 NOTE — Consult Note (Signed)
PATIENT NAME:  Kristina West, Kristina West MR#:  [id number] DATE OF BIRTH:  09/03/1932  DATE OF CONSULTATION:  03/04/2013  REFERRING PHYSICIAN:  Dr. Marcello Fennel. CONSULTING PHYSICIAN:  Christena Deem, MD  REASON FOR CONSULTATION: Post colonoscopy/polypectomy bleed.   HISTORY OF PRESENT ILLNESS: Ms. Luepke is a 81 year old Caucasian female who has been a patient of mine in the outpatient setting for a number of years. She has a history of recalcitrant gastroesophageal reflux which is currently doing well on Nexium, Carafate, and dietary measures. Also she will take some Phazyme at times.   She came into the hospital on 03/01/2013 for a colonoscopy in regards to her personal history of colon polyps. During that colonoscopy, she was found to have a large polyp, this being almost 30 mm in size in the cecum. This was removed with a cold snare and saline lift injection, and several hemostatic clips were placed to help prevent bleeding. Two smaller polyps were found, one in the cecum and one in the ascending colon, these both removed with cold snare. Both of these were noted to have good hemostasis at the time of resection. Otherwise, she did have diverticulosis. No other polyps.   She apparently went home and resumed eating a relatively regular diet. The following day began to have some bright red rectal bleeding and called GI on call, who was Dr. Mechele Collin, over the weekend. He had her come into the hospital. She was admitted. She did undergo a colonoscopy in the late afternoon on Saturday. The larger polypectomy site was found to be intact; however, one of the other two was found to have a clot in place. There were clips placed on both of these other sites and there was no active bleeding at that time.   The patient then went home; however, later that evening ate again a regular meal followed by some further bleeding, and she came back to the hospital and was admitted. Currently she is hemodynamically stable. She has  had no recurrent bleeding since her hospitalization. Her hemogram has been stable. She denies any nausea or abdominal pain. She is currently on a clear liquid diet.   PAST MEDICAL HISTORY:  She has a history of: 1.  Gastroesophageal reflux.  2.  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  3.  Osteoporosis.  4.  History of urinary retention and recurrent UTIs.  5.  Allergic rhinitis.  6.  Hyperlipidemia.  7.  Irritable bowel syndrome.  8.  Asthma.  9.  Atypical chest pain, cardiac catheterization negative in 2003.  10.  Hypertension.  11.  Iron deficiency anemia.  12.  Diverticulosis.  13.  Chronic pain syndrome with back and neck.  14.  Chronic abdominal pain.  15.  B12 deficiency.  16.  Multinodular goiter.     PAST SURGICAL HISTORY:  To include: 1.  Uterus seal repair in 1999.  2.  Hydrodilation, 1999.  3.  Cholecystectomy.  4.  Appendectomy.  5.  Hysterectomy.  6.  Multiple surgical fusions, lumbar, cervical laminectomies and lumbar fusion.  7.  Hiatal hernia repair/fundoplication.  8.  Bilateral laparoscopic oophorectomy and pelvic mass removal in 2005.  9.  Reconstructive lumbar surgery in 2008.  10.  Cervical fusion in 2009.  11.  Rotator cuff repair, 2011.  12.  Thyroid biopsy.   OUTPATIENT MEDICATIONS: Include the following: Altace 10 mg once a day, amlodipine 10 mg once a day, Astelin 137 mcg/inhalations 2 sprays each naris once a day, Caltrate 600 plus D  once a day, Carafate 1 g/10 mL oral solution 4 times a day, MiraLax oral powder 17 g daily, Nexium 40 mg once a day, Norco 10/325 mg once 4 times a day, Ultra Phazyme 180 mg oral capsule twice a day, vaginal Premarin twice a week.   ALLERGIES: ACIPHEX, AMPICILLIN, ANCEF. SHE IS INTOLERANT OF ANTI-INFLAMMATORIES, ASPIRIN. SHE IS ALLERGIC TO AUGMENTIN, CELEBREX, CIPRO, DILAUDID, KEFLEX, LIBRAX, MACRODANTIN, METHADONE, MORPHINE, NEURONTIN, NITROFURANTOIN, PANTOPRAZOLE, PENICILLIN, ROBAXIN, SULFA, TORADOL,  VALACYCLOVIR AND VIOXX.   PHYSICAL EXAMINATION:  VITAL SIGNS: Temperature 98.5, pulse 58, respirations 20, blood pressure 146/78, pulse ox 96%.  GENERAL: A [age] year old Caucasian female in no acute distress.  HEENT: Normocephalic, atraumatic.  EYES: Anicteric.  NOSE: Septum midline. No lesions.  OROPHARYNX: No lesions.  NECK: No JVD.  HEART: Regular rate and rhythm without rub or gallop.  LUNGS: Bilaterally clear.  ABDOMEN: Soft, nontender, nondistended. Bowel sounds positive, normoactive.  RECTAL: Anorectal exam is deferred.  EXTREMITIES: No clubbing, cyanosis, or edema.  NEUROLOGICAL: Cranial nerves II through XII grossly intact. Muscle strength bilaterally equal and symmetric, five out of five. Deep tendon reflexes bilaterally equal and symmetric.   LABORATORY, DIAGNOSTIC, AND RADIOLOGICAL DATA: Include the following from today: She had a hemogram with white count of 8.7, H and H 12.6/36.5, platelet count of 222. MCV 90. On admission to the hospital on November 1, she had a hemoglobin of 12.8. She has not required transfusion. Her INR is 1.0 with a pro time of 13.1. Urinalysis was negative. Echocardiogram showing an normal sinus rhythm, possible left atrial enlargement.   On admission, there was a PA and lateral chest film showing no evidence of acute cardiopulmonary abnormality and no evidence of free air.   ASSESSMENT: Postpolypectomy bleeding. This from one of the second 2 polypectomy sites. Please note above. The patient has been stable hemodynamically as well as by labs. She has had no recurrent bleeding since coming back to the hospital last night. I believe that part of the problem was exacerbating by her eating regular diet although instructions were given for opposite. There was some confusion for the patient.   RECOMMENDATION: Continue clear liquid diet, serial hemoglobins, transfuse as needed.   We will follow with you.   Thank you for the assistance.     ____________________________ Christena DeemMartin U. Skulskie, MD mus:np D: 03/04/2013 16:41:17 ET T: 03/04/2013 16:50:34 ET JOB#: [id number]  cc: Christena DeemMartin U. Skulskie, MD, <Dictator> Christena DeemMARTIN U SKULSKIE MD ELECTRONICALLY SIGNED 03/22/2013 8:09

## 2014-08-22 NOTE — Consult Note (Signed)
Pt had post polypectomy GI bleeding with successful placement of clips on the polypectomy sites yesterday.  No further bleeding.  Hgb stqable at 12, plt 207, VSS afebrile.  I think if she does well with full liquid breakfast she could go home today.  Follow up with Dr. Marva PandaSkulskie as previously scheduled.  Electronic Signatures: Scot JunElliott, Zoii Florer T (MD)  (Signed on 02-Nov-14 08:09)  Authored  Last Updated: 02-Nov-14 08:09 by Scot JunElliott, Crystalina Stodghill T (MD)
